# Patient Record
Sex: Male | Born: 1970 | Race: White | Hispanic: No | Marital: Married | Smoking: Current every day smoker
Health system: Southern US, Community
[De-identification: ages and names within clinical notes are randomized; demographics above are authoritative.]

## PROBLEM LIST (undated history)

## (undated) DIAGNOSIS — M549 Dorsalgia, unspecified: Secondary | ICD-10-CM

## (undated) DIAGNOSIS — M109 Gout, unspecified: Secondary | ICD-10-CM

---

## 2007-09-15 ENCOUNTER — Emergency Department (HOSPITAL_COMMUNITY): Admission: EM | Admit: 2007-09-15 | Discharge: 2007-09-15 | Payer: Self-pay | Admitting: Emergency Medicine

## 2009-12-05 ENCOUNTER — Emergency Department (HOSPITAL_BASED_OUTPATIENT_CLINIC_OR_DEPARTMENT_OTHER): Admission: EM | Admit: 2009-12-05 | Discharge: 2009-12-05 | Payer: Self-pay | Admitting: Emergency Medicine

## 2009-12-05 ENCOUNTER — Ambulatory Visit: Payer: Self-pay | Admitting: Interventional Radiology

## 2009-12-15 ENCOUNTER — Encounter: Admission: RE | Admit: 2009-12-15 | Discharge: 2009-12-15 | Payer: Self-pay | Admitting: Emergency Medicine

## 2010-11-12 LAB — ETHANOL: Alcohol, Ethyl (B): 65 — ABNORMAL HIGH

## 2017-02-13 ENCOUNTER — Encounter (HOSPITAL_COMMUNITY): Payer: Self-pay | Admitting: Emergency Medicine

## 2017-02-13 ENCOUNTER — Emergency Department (HOSPITAL_COMMUNITY)
Admission: EM | Admit: 2017-02-13 | Discharge: 2017-02-13 | Disposition: A | Discharge status: Home / Self Care | Payer: Managed Care, Other (non HMO) | Source: Home / Self Care

## 2017-02-13 ENCOUNTER — Emergency Department (HOSPITAL_BASED_OUTPATIENT_CLINIC_OR_DEPARTMENT_OTHER)
Admission: EM | Admit: 2017-02-13 | Discharge: 2017-02-13 | Disposition: A | Discharge status: Home / Self Care | Payer: Managed Care, Other (non HMO) | Attending: Emergency Medicine | Admitting: Emergency Medicine

## 2017-02-13 ENCOUNTER — Other Ambulatory Visit: Payer: Self-pay

## 2017-02-13 ENCOUNTER — Encounter (HOSPITAL_BASED_OUTPATIENT_CLINIC_OR_DEPARTMENT_OTHER): Payer: Self-pay | Admitting: *Deleted

## 2017-02-13 DIAGNOSIS — S61211A Laceration without foreign body of left index finger without damage to nail, initial encounter: Secondary | ICD-10-CM | POA: Diagnosis not present

## 2017-02-13 DIAGNOSIS — W290XXA Contact with powered kitchen appliance, initial encounter: Secondary | ICD-10-CM | POA: Insufficient documentation

## 2017-02-13 DIAGNOSIS — Z5321 Procedure and treatment not carried out due to patient leaving prior to being seen by health care provider: Secondary | ICD-10-CM

## 2017-02-13 DIAGNOSIS — Y939 Activity, unspecified: Secondary | ICD-10-CM | POA: Insufficient documentation

## 2017-02-13 DIAGNOSIS — S61213A Laceration without foreign body of left middle finger without damage to nail, initial encounter: Secondary | ICD-10-CM | POA: Diagnosis not present

## 2017-02-13 DIAGNOSIS — Y929 Unspecified place or not applicable: Secondary | ICD-10-CM | POA: Insufficient documentation

## 2017-02-13 DIAGNOSIS — F17290 Nicotine dependence, other tobacco product, uncomplicated: Secondary | ICD-10-CM | POA: Diagnosis not present

## 2017-02-13 DIAGNOSIS — S60415A Abrasion of left ring finger, initial encounter: Secondary | ICD-10-CM

## 2017-02-13 DIAGNOSIS — S61221A Laceration with foreign body of left index finger without damage to nail, initial encounter: Secondary | ICD-10-CM | POA: Insufficient documentation

## 2017-02-13 DIAGNOSIS — Y999 Unspecified external cause status: Secondary | ICD-10-CM | POA: Insufficient documentation

## 2017-02-13 DIAGNOSIS — W269XXA Contact with unspecified sharp object(s), initial encounter: Secondary | ICD-10-CM | POA: Insufficient documentation

## 2017-02-13 DIAGNOSIS — S6992XA Unspecified injury of left wrist, hand and finger(s), initial encounter: Secondary | ICD-10-CM | POA: Diagnosis present

## 2017-02-13 HISTORY — DX: Gout, unspecified: M10.9

## 2017-02-13 MED ORDER — LIDOCAINE HCL (PF) 1 % IJ SOLN
10.0000 mL | Freq: Once | INTRAMUSCULAR | Status: AC
Start: 2017-02-13 — End: 2017-02-13
  Administered 2017-02-13: 10 mL
  Filled 2017-02-13: qty 10

## 2017-02-13 NOTE — ED Provider Notes (Signed)
[facility] EMERGENCY DEPARTMENT Provider Note   CSN: [id number] Arrival date & time: 02/13/17  [id number]     History   Chief Complaint Chief Complaint  Patient presents with  . Laceration    HPI Roy Molina is a 46 y.o. male.  HPI   46 year old male with finger lacerations.  Happened just before arrival.  Reaching into the dishwasher and cut his hand sharp plates of food processor.  He sustained lacerations to the dorsal aspect of index and middle fingers.  No numbness or tingling.  Controlled with pressure.  Reports that tetanus is current.  Past Medical History:  Diagnosis Date  . Gout     There are no active problems to display for this patient.   History reviewed. No pertinent surgical history.     Home Medications    Prior to Admission medications   Not on File    Family History No family history on file.  Social History Social History   Tobacco Use  . Smoking status: Current Every Day Smoker    Types: E-cigarettes  . Smokeless tobacco: Current User  Substance Use Topics  . Alcohol use: Yes    Comment: daily  . Drug use: No     Allergies   Bee venom; Molds & smuts; and Penicillins   Review of Systems Review of Systems  All systems reviewed and negative, other than as noted in HPI.  Physical Exam Updated Vital Signs BP (!) 120/100 (BP Location: Right Arm)   Pulse 88   Temp 97.9 F (36.6 C) (Oral)   Resp 16   Ht 5\' 10"  (1.778 m)   Wt 97.5 kg (215 lb)   SpO2 98%   BMI 30.85 kg/m   Physical Exam  Constitutional: He appears well-developed and well-nourished. No distress.  HENT:  Head: Normocephalic and atraumatic.  Eyes: Conjunctivae are normal. Right eye exhibits no discharge. Left eye exhibits no discharge.  Neck: Neck supple.  Cardiovascular: Normal rate, regular rhythm and normal heart sounds. Exam reveals no gallop and no friction rub.  No murmur heard. Pulmonary/Chest: Effort normal and breath sounds normal. No  respiratory distress.  Abdominal: Soft. He exhibits no distension. There is no tenderness.  Musculoskeletal: He exhibits no edema or tenderness.       Hands: Lacerations in the pictured area.  The laceration over the middle finger has a thin flap  He is able to actively extend both fingers against resistance.  No tendon or FB visualized.  Neurovascularly distally.  Mild bleeding.  Neurological: He is alert.  Skin: Skin is warm and dry.  Psychiatric: He has a normal mood and affect. His behavior is normal. Thought content normal.  Nursing note and vitals reviewed.    ED Treatments / Results  Labs (all labs ordered are listed, but only abnormal results are displayed) Labs Reviewed - No data to display  EKG  EKG Interpretation None       Radiology No results found.  Procedures Procedures (including critical care time)  LACERATION REPAIR Performed by: Raeford RazorStephen Annisten [place] Authorized by: Raeford RazorStephen Valerian Jewel Consent: Verbal consent obtained. Risks and benefits: risks, benefits and alternatives were discussed Consent given by: patient Patient identity confirmed: provided demographic data Prepped and Draped in normal sterile fashion Wound explored  Laceration Location: L index finger Laceration Length: 2.5 cm  No Foreign Bodies seen or palpated  Anesthesia: Digital block 1% lidocaine. Anesthetic total: 1 ml  Irrigation method: Copiously irrigated in the sink  Skin closure: Simple interrupted  using 4-0 Prolene  Number of sutures: 4  Patient tolerance: Patient tolerated the procedure well with no immediate complications.  LACERATION REPAIR Performed by: Raeford RazorStephen Kashmere Daywalt Authorized by: Raeford RazorStephen Vanden Fawaz Consent: Verbal consent obtained. Risks and benefits: risks, benefits and alternatives were discussed Consent given by: patient Patient identity confirmed: provided demographic data Prepped and Draped in normal sterile fashion Wound explored  Laceration Location: middle  finger  Laceration Length: 3cm  No Foreign Bodies seen or palpated  Anesthesia: local infiltration  Local anesthetic: Digital block with 1% lidocaine  Anesthetic total: 2 ml  Irrigation method: syringe Amount of cleaning: standard  Skin closure: 4-0 Prolene.  Simple interrupted and horizontal mattress sutures.  Number of sutures: 6   Patient tolerance: Patient tolerated the procedure well with no immediate complications.  NERVE BLOCK Performed by: Raeford RazorStephen Suezette Lafave Consent: Verbal consent obtained. Required items: required blood products, implants, devices, and special equipment available Time out: Immediately prior to procedure a "time out" was called to verify the correct patient, procedure, equipment, support staff and site/side marked as required.  Indication: Pain relief, wound exploration Nerve block body site: Middle and index fingers left hand  Preparation: Patient was prepped and draped in the usual sterile fashion. Needle gauge: 25 G Location technique: anatomical landmarks  Local anesthetic: 1% lidocaine without epinephrine  Anesthetic total: 3 ml  Outcome: pain improved Patient tolerance: Patient tolerated the procedure well with no immediate complications.  Medications Ordered in ED Medications  lidocaine (PF) (XYLOCAINE) 1 % injection 10 mL (10 mLs Other Given by Other 02/13/17 [id number])     Initial Impression / Assessment and Plan / ED Course  I have reviewed the triage vital signs and the nursing notes.  Pertinent labs & imaging results that were available during my care of the patient were reviewed by me and considered in my medical decision making (see chart for details).     46yM with lacerations to index and ring fingers L hand. NVI. No tendon involvement. NVI. Sutured. Continued wound care.   Final Clinical Impressions(s) / ED Diagnoses   Final diagnoses:  Laceration of left index finger without foreign body without damage to nail, initial  encounter  Laceration of left middle finger without foreign body without damage to nail, initial encounter  Abrasion of left ring finger, initial encounter    ED Discharge Orders    None       Raeford RazorKohut, Jaivyn Gulla, MD 02/13/17 1027

## 2017-02-13 NOTE — ED Triage Notes (Signed)
Pt states while reaching in dishwasher this am, L hand hit food processor blade causing lacerations to posterior 1st, 2nd and small lac to 3rd finger. Bleeding controlled, bandage applied.

## 2017-02-13 NOTE — ED Notes (Signed)
Notified by nurse first staff that pt left

## 2017-02-13 NOTE — ED Notes (Signed)
ED Provider at bedside. 

## 2017-02-13 NOTE — ED Triage Notes (Signed)
Pt reports cutting his L 1st, 2nd and 3rd fingers after reaching into dishwasher and hitting a blade from a food processor. Fingers wrapped in gauze -- dressing clean, dry and intact.

## 2017-02-13 NOTE — ED Notes (Addendum)
This NT tried talking with pt and visitor about staying and advised that there will be a PA that is coming in at 0900 and we will be seeing the pt for the laceration immediately. Pt acknowledge and notified visitor, visitor was adamant about leaving and going to another facility.

## 2018-03-03 ENCOUNTER — Emergency Department (HOSPITAL_BASED_OUTPATIENT_CLINIC_OR_DEPARTMENT_OTHER)
Admission: EM | Admit: 2018-03-03 | Discharge: 2018-03-03 | Disposition: A | Discharge status: Home / Self Care | Payer: BLUE CROSS/BLUE SHIELD | Attending: Emergency Medicine | Admitting: Emergency Medicine

## 2018-03-03 ENCOUNTER — Other Ambulatory Visit: Payer: Self-pay

## 2018-03-03 ENCOUNTER — Encounter (HOSPITAL_BASED_OUTPATIENT_CLINIC_OR_DEPARTMENT_OTHER): Payer: Self-pay | Admitting: Emergency Medicine

## 2018-03-03 DIAGNOSIS — F17290 Nicotine dependence, other tobacco product, uncomplicated: Secondary | ICD-10-CM | POA: Insufficient documentation

## 2018-03-03 DIAGNOSIS — L03116 Cellulitis of left lower limb: Secondary | ICD-10-CM | POA: Diagnosis not present

## 2018-03-03 DIAGNOSIS — R2242 Localized swelling, mass and lump, left lower limb: Secondary | ICD-10-CM | POA: Diagnosis present

## 2018-03-03 MED ORDER — DOXYCYCLINE HYCLATE 100 MG PO CAPS: 100.0000 mg | ORAL_CAPSULE | Freq: Two times a day (BID) | ORAL | 0 refills | Status: DC

## 2018-03-03 MED ORDER — DOXYCYCLINE HYCLATE 100 MG PO TABS
100.0000 mg | ORAL_TABLET | Freq: Once | ORAL | Status: AC
  Administered 2018-03-03: 100 mg via ORAL
  Filled 2018-03-03: qty 1

## 2018-03-03 NOTE — ED Provider Notes (Signed)
[facility] EMERGENCY DEPARTMENT Provider Note   CSN: [id number] Arrival date & time: 03/03/18  1423     History   Chief Complaint Chief Complaint  Patient presents with  . Abscess    HPI Roy Molina is a 48 y.o. male history of gout here presenting with left eye redness.  He states that he may have been bitten by a spider about 2 days ago on the left inner thigh.  Patient states that he noticed that the area became more swollen and painful.  Denies any purulent discharge.  Denies any fevers or chills and patient does not use IV drugs.  Patient states that he had previous spider bites that required oral antibiotics but never required I&D.  The history is provided by the patient.    Past Medical History:  Diagnosis Date  . Gout     There are no active problems to display for this patient.   History reviewed. No pertinent surgical history.      Home Medications    Prior to Admission medications   Not on File    Family History No family history on file.  Social History Social History   Tobacco Use  . Smoking status: Current Every Day Smoker    Types: E-cigarettes  . Smokeless tobacco: Current User  Substance Use Topics  . Alcohol use: Yes    Comment: daily  . Drug use: No     Allergies   Bee venom; Molds & smuts; and Penicillins   Review of Systems Review of Systems  Skin: Positive for color change.  All other systems reviewed and are negative.    Physical Exam Updated Vital Signs BP (!) 135/96 (BP Location: Left Arm)   Pulse 88   Temp 98.4 F (36.9 C) (Oral)   Resp 18   Ht 5\' 10"  (1.778 m)   Wt 102.1 kg   SpO2 98%   BMI 32.28 kg/m   Physical Exam Vitals signs and nursing note reviewed.  Constitutional:      Appearance: Normal appearance.  HENT:     Head: Normocephalic.     Right Ear: Tympanic membrane normal.     Left Ear: Tympanic membrane normal.     Nose: Nose normal.     Mouth/Throat:     Mouth: Mucous  membranes are moist.  Eyes:     Extraocular Movements: Extraocular movements intact.     Pupils: Pupils are equal, round, and reactive to light.  Neck:     Musculoskeletal: Normal range of motion.  Cardiovascular:     Rate and Rhythm: Normal rate and regular rhythm.     Pulses: Normal pulses.     Heart sounds: Normal heart sounds.  Pulmonary:     Effort: Pulmonary effort is normal.     Breath sounds: Normal breath sounds.  Abdominal:     General: Abdomen is flat.     Palpations: Abdomen is soft.  Musculoskeletal: Normal range of motion.     Comments: L inner thigh with 3 cm area of erythema, no obvious fluctuance,   Skin:    General: Skin is warm.     Capillary Refill: Capillary refill takes less than 2 seconds.     Findings: Erythema present.  Neurological:     General: No focal deficit present.     Mental Status: He is alert and oriented to person, place, and time.  Psychiatric:        Mood and Affect: Mood normal.  Behavior: Behavior normal.      ED Treatments / Results  Labs (all labs ordered are listed, but only abnormal results are displayed) Labs Reviewed - No data to display  EKG None  Radiology No results found.  Procedures Procedures (including critical care time)  EMERGENCY DEPARTMENT US SOFT TISSUE INTERPRETATION "Study: Limited Soft Tissue Ultrasound"  INDICATIONS: Soft tissue infection Multiple views of the body part were obtained in real-time with a multi-frequency linear probe  PERFORMED BY: Myself IMAGES ARCHIVED?: Yes SIDE:Left BODY PART:Lower extremity INTERPRETATION:  No abcess noted and Cellulitis present     Medications Ordered in ED Medications - No data to display   Initial Impression / Assessment and Plan / ED Course  I have reviewed the triage vital signs and the nursing notes.  Pertinent labs & imaging results that were available during my care of the patient were reviewed by me and considered in my medical decision  making (see chart for details).     Roy Molina is a 48 y.o. male here with possible bug bite to L lower leg. There is no obvious abscess on [place], just mild cellulitis. Will dc home with course of doxycycline. Gave strict return precautions    Final Clinical Impressions(s) / ED Diagnoses   Final diagnoses:  None    ED Discharge Orders    None       Charlynne Pander, MD 03/03/18 0900

## 2018-03-03 NOTE — Discharge Instructions (Signed)
Take doxycycline twice daily for a week   Take tylenol, motrin for pain   See your doctor   Return to ER if you have worse leg pain, swelling, purulent discharge, fever.

## 2018-03-03 NOTE — ED Notes (Signed)
ED Provider at bedside. 

## 2018-03-03 NOTE — ED Triage Notes (Signed)
Pt reports possible spider bite or abscess to L inner thigh x 2 days.

## 2019-05-10 ENCOUNTER — Ambulatory Visit: Payer: BLUE CROSS/BLUE SHIELD

## 2020-05-06 ENCOUNTER — Other Ambulatory Visit: Payer: Self-pay

## 2020-05-06 ENCOUNTER — Encounter: Payer: Self-pay | Admitting: Plastic Surgery

## 2020-05-06 ENCOUNTER — Ambulatory Visit (INDEPENDENT_AMBULATORY_CARE_PROVIDER_SITE_OTHER): Payer: BC Managed Care – PPO | Admitting: Plastic Surgery

## 2020-05-06 VITALS — BP 125/88 | HR 90 | Ht 70.0 in | Wt 228.4 lb

## 2020-05-06 DIAGNOSIS — L989 Disorder of the skin and subcutaneous tissue, unspecified: Secondary | ICD-10-CM | POA: Diagnosis not present

## 2020-05-06 NOTE — Progress Notes (Signed)
   Referring Provider Medicine, [facility] [address] [place],  [place] [address]   CC:  Chief Complaint  Patient presents with  . consult      Roy Molina is an 50 y.o. male.  HPI: Patient presents to discuss a left shin cystic lesion.  Is been present for at least several months.  It is painful particularly in the mornings and seems to be more painful when he walks.  He is interested in having it removed.  Allergies  Allergen Reactions  . Bee Venom   . Molds & Smuts   . Penicillins     Outpatient Encounter Medications as of 05/06/2020  Medication Sig  . gabapentin (NEURONTIN) 100 MG capsule Take 200 mg by mouth 2 (two) times daily.  Marland Kitchen doxycycline (VIBRAMYCIN) 100 MG capsule Take 1 capsule (100 mg total) by mouth 2 (two) times daily. One po bid x 7 days   No facility-administered encounter medications on file as of 05/06/2020.     Past Medical History:  Diagnosis Date  . Gout     No past surgical history on file.  No family history on file.  Social History   Social History Narrative  . Not on file     Review of Systems General: Denies fevers, chills, weight loss CV: Denies chest pain, shortness of breath, palpitations  Physical Exam Vitals with BMI 05/06/2020 03/03/2018 02/13/2017  Height 5\' 10"  5\' 10"  -  Weight 228 lbs 6 oz 225 lbs -  BMI 32.77 32.28 -  Systolic 125 135  Diastolic 88 96 93  Pulse 90 88 84    General:  No acute distress,  Alert and oriented, Non-Toxic, Normal speech and affect Examination shows a 1 to 2 cm sized cystic lesion in the middle third of the left leg.  This is lateral to the tibia.  There is no overlying skin changes.  Assessment/Plan Patient presents with a symptomatic small cystic lesion of the left lower extremity.  We discussed excision.  We discussed the risk that include bleeding, infection, damage to surrounding structures need for additional procedures.  All his questions were  answered and we will plan to move forward.  05/06/2020, 11:01 AM

## 2020-06-18 ENCOUNTER — Other Ambulatory Visit (HOSPITAL_COMMUNITY)
Admission: RE | Admit: 2020-06-18 | Discharge: 2020-06-18 | Disposition: A | Discharge status: Home / Self Care | Payer: BC Managed Care – PPO | Source: Ambulatory Visit | Attending: Plastic Surgery | Admitting: Plastic Surgery

## 2020-06-18 ENCOUNTER — Encounter: Payer: Self-pay | Admitting: Plastic Surgery

## 2020-06-18 ENCOUNTER — Ambulatory Visit (INDEPENDENT_AMBULATORY_CARE_PROVIDER_SITE_OTHER): Payer: BC Managed Care – PPO | Admitting: Plastic Surgery

## 2020-06-18 ENCOUNTER — Other Ambulatory Visit: Payer: Self-pay

## 2020-06-18 VITALS — BP 132/83 | HR 91

## 2020-06-18 DIAGNOSIS — L989 Disorder of the skin and subcutaneous tissue, unspecified: Secondary | ICD-10-CM

## 2020-06-18 NOTE — Progress Notes (Signed)
Operative Note   DATE OF OPERATION: 06/18/2020  LOCATION:    SURGICAL DEPARTMENT: Plastic Surgery  PREOPERATIVE DIAGNOSES: Left leg cyst  POSTOPERATIVE DIAGNOSES:  same  PROCEDURE:  1. Excision of left leg cyst measuring 2 cm 2. Complex closure measuring 2 cm  SURGEON: Ancil Linsey, MD  ANESTHESIA:  Local  COMPLICATIONS: None.   INDICATIONS FOR PROCEDURE:  The patient, Roy Molina is a 50 y.o. male born on [birth year], is here for treatment of left leg cyst MRN: [id number]  CONSENT:  Informed consent was obtained directly from the patient. Risks, benefits and alternatives were fully discussed. Specific risks including but not limited to bleeding, infection, hematoma, seroma, scarring, pain, infection, wound healing problems, and need for further surgery were all discussed. The patient did have an ample opportunity to have questions answered to satisfaction.   DESCRIPTION OF PROCEDURE:  Local anesthesia was administered. The patient's operative site was prepped and draped in a sterile fashion. A time out was performed and all information was confirmed to be correct.  The lesion was excised with a 15 blade.  Hemostasis was obtained.  The excision went down to fascia.  No other subcutaneous masses were palpated deep to this.  Circumferential undermining was performed and the skin was advanced and closed in layers with interrupted buried Monocryl sutures and 3-0 Monocryl for the skin.    The patient tolerated the procedure well.  There were no complications.

## 2020-06-22 LAB — SURGICAL PATHOLOGY

## 2020-06-29 ENCOUNTER — Ambulatory Visit (INDEPENDENT_AMBULATORY_CARE_PROVIDER_SITE_OTHER): Payer: BC Managed Care – PPO | Admitting: Surgical

## 2020-06-29 ENCOUNTER — Other Ambulatory Visit: Payer: Self-pay

## 2020-06-29 DIAGNOSIS — L989 Disorder of the skin and subcutaneous tissue, unspecified: Secondary | ICD-10-CM

## 2020-06-29 NOTE — Progress Notes (Signed)
50 year old male here for follow-up after excision of left leg cyst with Dr. Arita Miss on 06/18/2020.  He is 2 weeks postop. Pathology showed fat necrosis He is doing well.  On exam the incision is intact and healing well.  There is some surrounding redness that appears irritated.  No cellulitic changes.  No fluctuance or purulence noted.  Suture knot and suture removed.  Patient tolerated this well.  The area was covered with a Steri-Strip.  Recommend leaving this in place for a few days or until it falls off.  Recommend calling with questions or concerns, follow-up as needed.

## 2020-10-12 ENCOUNTER — Inpatient Hospital Stay (HOSPITAL_COMMUNITY)
Admission: EM | Admit: 2020-10-12 | Discharge: 2020-10-15 | DRG: 440 | Disposition: A | Discharge status: Home / Self Care | Payer: BC Managed Care – PPO | Attending: Student in an Organized Health Care Education/Training Program | Admitting: Student in an Organized Health Care Education/Training Program

## 2020-10-12 ENCOUNTER — Encounter (HOSPITAL_COMMUNITY): Payer: Self-pay

## 2020-10-12 ENCOUNTER — Other Ambulatory Visit: Payer: Self-pay

## 2020-10-12 DIAGNOSIS — Z880 Allergy status to penicillin: Secondary | ICD-10-CM

## 2020-10-12 DIAGNOSIS — Z79899 Other long term (current) drug therapy: Secondary | ICD-10-CM

## 2020-10-12 DIAGNOSIS — K859 Acute pancreatitis without necrosis or infection, unspecified: Secondary | ICD-10-CM | POA: Diagnosis not present

## 2020-10-12 DIAGNOSIS — K852 Alcohol induced acute pancreatitis without necrosis or infection: Secondary | ICD-10-CM | POA: Diagnosis not present

## 2020-10-12 DIAGNOSIS — Z91030 Bee allergy status: Secondary | ICD-10-CM

## 2020-10-12 DIAGNOSIS — Z20822 Contact with and (suspected) exposure to covid-19: Secondary | ICD-10-CM | POA: Diagnosis present

## 2020-10-12 DIAGNOSIS — Z87892 Personal history of anaphylaxis: Secondary | ICD-10-CM

## 2020-10-12 DIAGNOSIS — Z791 Long term (current) use of non-steroidal anti-inflammatories (NSAID): Secondary | ICD-10-CM

## 2020-10-12 DIAGNOSIS — Z91048 Other nonmedicinal substance allergy status: Secondary | ICD-10-CM

## 2020-10-12 DIAGNOSIS — M109 Gout, unspecified: Secondary | ICD-10-CM | POA: Diagnosis present

## 2020-10-12 DIAGNOSIS — J45909 Unspecified asthma, uncomplicated: Secondary | ICD-10-CM | POA: Diagnosis present

## 2020-10-12 DIAGNOSIS — K853 Drug induced acute pancreatitis without necrosis or infection: Secondary | ICD-10-CM | POA: Diagnosis present

## 2020-10-12 DIAGNOSIS — F17290 Nicotine dependence, other tobacco product, uncomplicated: Secondary | ICD-10-CM | POA: Diagnosis present

## 2020-10-12 DIAGNOSIS — T39395A Adverse effect of other nonsteroidal anti-inflammatory drugs [NSAID], initial encounter: Secondary | ICD-10-CM | POA: Diagnosis present

## 2020-10-12 HISTORY — DX: Dorsalgia, unspecified: M54.9

## 2020-10-12 LAB — CBC WITH DIFFERENTIAL/PLATELET
Abs Immature Granulocytes: 0.08 10*3/uL — ABNORMAL HIGH (ref 0.00–0.07)
Basophils Absolute: 0.1 10*3/uL (ref 0.0–0.1)
Basophils Relative: 0 %
Eosinophils Absolute: 0.1 10*3/uL (ref 0.0–0.5)
Eosinophils Relative: 1 %
HCT: 46.2 % (ref 39.0–52.0)
Hemoglobin: 15.8 g/dL (ref 13.0–17.0)
Immature Granulocytes: 1 %
Lymphocytes Relative: 10 %
Lymphs Abs: 1.8 10*3/uL (ref 0.7–4.0)
MCH: 30.3 pg (ref 26.0–34.0)
MCHC: 34.2 g/dL (ref 30.0–36.0)
MCV: 88.7 fL (ref 80.0–100.0)
Monocytes Absolute: 1.3 10*3/uL — ABNORMAL HIGH (ref 0.1–1.0)
Monocytes Relative: 8 %
Neutro Abs: 14 10*3/uL — ABNORMAL HIGH (ref 1.7–7.7)
Neutrophils Relative %: 80 %
Platelets: 330 10*3/uL (ref 150–400)
RBC: 5.21 MIL/uL (ref 4.22–5.81)
RDW: 13.1 % (ref 11.5–15.5)
WBC: 17.3 10*3/uL — ABNORMAL HIGH (ref 4.0–10.5)
nRBC: 0 % (ref 0.0–0.2)

## 2020-10-12 LAB — URINALYSIS, ROUTINE W REFLEX MICROSCOPIC
Bilirubin Urine: NEGATIVE
Glucose, UA: NEGATIVE mg/dL
Hgb urine dipstick: NEGATIVE
Ketones, ur: NEGATIVE mg/dL
Leukocytes,Ua: NEGATIVE
Nitrite: NEGATIVE
Protein, ur: NEGATIVE mg/dL
Specific Gravity, Urine: 1.02 (ref 1.005–1.030)
pH: 6 (ref 5.0–8.0)

## 2020-10-12 LAB — COMPREHENSIVE METABOLIC PANEL
ALT: 27 U/L (ref 0–44)
AST: 21 U/L (ref 15–41)
Albumin: 4.1 g/dL (ref 3.5–5.0)
Alkaline Phosphatase: 57 U/L (ref 38–126)
Anion gap: 13 (ref 5–15)
BUN: 14 mg/dL (ref 6–20)
CO2: 23 mmol/L (ref 22–32)
Calcium: 9.2 mg/dL (ref 8.9–10.3)
Chloride: 101 mmol/L (ref 98–111)
Creatinine, Ser: 1.06 mg/dL (ref 0.61–1.24)
GFR, Estimated: >60 mL/min (ref >60)
Glucose, Bld: 142 mg/dL — ABNORMAL HIGH (ref 70–99)
Potassium: 3.9 mmol/L (ref 3.5–5.1)
Sodium: 137 mmol/L (ref 135–145)
Total Bilirubin: 1.4 mg/dL — ABNORMAL HIGH (ref 0.3–1.2)
Total Protein: 7.2 g/dL (ref 6.5–8.1)

## 2020-10-12 LAB — LIPASE, BLOOD: Lipase: 509 U/L — ABNORMAL HIGH (ref 11–51)

## 2020-10-12 NOTE — ED Triage Notes (Signed)
Pt c/o severe abdominal pain and cold sweats since 1300 today. Pt denies nausea, vomiting, or diarrhea.

## 2020-10-13 ENCOUNTER — Encounter (HOSPITAL_COMMUNITY): Payer: Self-pay | Admitting: Student

## 2020-10-13 ENCOUNTER — Emergency Department (HOSPITAL_COMMUNITY): Payer: BC Managed Care – PPO

## 2020-10-13 ENCOUNTER — Observation Stay (HOSPITAL_COMMUNITY): Payer: BC Managed Care – PPO

## 2020-10-13 DIAGNOSIS — J45909 Unspecified asthma, uncomplicated: Secondary | ICD-10-CM | POA: Diagnosis present

## 2020-10-13 DIAGNOSIS — Z91048 Other nonmedicinal substance allergy status: Secondary | ICD-10-CM | POA: Diagnosis not present

## 2020-10-13 DIAGNOSIS — Z91030 Bee allergy status: Secondary | ICD-10-CM | POA: Diagnosis not present

## 2020-10-13 DIAGNOSIS — T39395A Adverse effect of other nonsteroidal anti-inflammatory drugs [NSAID], initial encounter: Secondary | ICD-10-CM | POA: Diagnosis present

## 2020-10-13 DIAGNOSIS — Z20822 Contact with and (suspected) exposure to covid-19: Secondary | ICD-10-CM | POA: Diagnosis present

## 2020-10-13 DIAGNOSIS — K853 Drug induced acute pancreatitis without necrosis or infection: Secondary | ICD-10-CM | POA: Diagnosis present

## 2020-10-13 DIAGNOSIS — Z791 Long term (current) use of non-steroidal anti-inflammatories (NSAID): Secondary | ICD-10-CM | POA: Diagnosis not present

## 2020-10-13 DIAGNOSIS — Z880 Allergy status to penicillin: Secondary | ICD-10-CM | POA: Diagnosis not present

## 2020-10-13 DIAGNOSIS — K852 Alcohol induced acute pancreatitis without necrosis or infection: Secondary | ICD-10-CM | POA: Diagnosis present

## 2020-10-13 DIAGNOSIS — K859 Acute pancreatitis without necrosis or infection, unspecified: Secondary | ICD-10-CM | POA: Diagnosis present

## 2020-10-13 DIAGNOSIS — Z79899 Other long term (current) drug therapy: Secondary | ICD-10-CM | POA: Diagnosis not present

## 2020-10-13 DIAGNOSIS — Z87892 Personal history of anaphylaxis: Secondary | ICD-10-CM | POA: Diagnosis not present

## 2020-10-13 DIAGNOSIS — F17290 Nicotine dependence, other tobacco product, uncomplicated: Secondary | ICD-10-CM | POA: Diagnosis present

## 2020-10-13 DIAGNOSIS — M109 Gout, unspecified: Secondary | ICD-10-CM | POA: Diagnosis present

## 2020-10-13 LAB — RESP PANEL BY RT-PCR (FLU A&B, COVID) ARPGX2
Influenza A by PCR: NEGATIVE
Influenza B by PCR: NEGATIVE
SARS Coronavirus 2 by RT PCR: NEGATIVE

## 2020-10-13 MED ORDER — ACETAMINOPHEN 325 MG PO TABS
650.0000 mg | ORAL_TABLET | Freq: Four times a day (QID) | ORAL | Status: DC | PRN
  Administered 2020-10-14 – 2020-10-15 (×3): 650 mg via ORAL
  Filled 2020-10-13 (×3): qty 2

## 2020-10-13 MED ORDER — LACTATED RINGERS IV SOLN: INTRAVENOUS | Status: AC

## 2020-10-13 MED ORDER — ONDANSETRON HCL 4 MG/2ML IJ SOLN
4.0000 mg | Freq: Four times a day (QID) | INTRAMUSCULAR | Status: DC | PRN
  Administered 2020-10-13 – 2020-10-14 (×3): 4 mg via INTRAVENOUS
  Filled 2020-10-13 (×3): qty 2

## 2020-10-13 MED ORDER — SENNOSIDES-DOCUSATE SODIUM 8.6-50 MG PO TABS: 1.0000 | ORAL_TABLET | Freq: Every evening | ORAL | Status: DC | PRN

## 2020-10-13 MED ORDER — RIVAROXABAN 10 MG PO TABS
10.0000 mg | ORAL_TABLET | Freq: Every day | ORAL | Status: DC
  Administered 2020-10-13 – 2020-10-15 (×3): 10 mg via ORAL
  Filled 2020-10-13 (×3): qty 1

## 2020-10-13 MED ORDER — ACETAMINOPHEN 650 MG RE SUPP: 650.0000 mg | Freq: Four times a day (QID) | RECTAL | Status: DC | PRN

## 2020-10-13 MED ORDER — MORPHINE SULFATE (PF) 4 MG/ML IV SOLN
4.0000 mg | Freq: Once | INTRAVENOUS | Status: AC
  Administered 2020-10-13: 4 mg via INTRAVENOUS
  Filled 2020-10-13: qty 1

## 2020-10-13 MED ORDER — ONDANSETRON HCL 4 MG/2ML IJ SOLN
4.0000 mg | Freq: Once | INTRAMUSCULAR | Status: AC
  Administered 2020-10-13: 4 mg via INTRAVENOUS
  Filled 2020-10-13: qty 2

## 2020-10-13 MED ORDER — HYDROMORPHONE HCL 1 MG/ML IJ SOLN
1.0000 mg | Freq: Once | INTRAMUSCULAR | Status: AC
  Administered 2020-10-13: 1 mg via INTRAVENOUS
  Filled 2020-10-13: qty 1

## 2020-10-13 MED ORDER — SODIUM CHLORIDE 0.9% FLUSH
3.0000 mL | Freq: Two times a day (BID) | INTRAVENOUS | Status: DC
  Administered 2020-10-13 – 2020-10-15 (×2): 3 mL via INTRAVENOUS

## 2020-10-13 MED ORDER — IOHEXOL 350 MG/ML SOLN
80.0000 mL | Freq: Once | INTRAVENOUS | Status: AC | PRN
  Administered 2020-10-13: 80 mL via INTRAVENOUS

## 2020-10-13 MED ORDER — LACTATED RINGERS IV BOLUS
1000.0000 mL | Freq: Once | INTRAVENOUS | Status: AC
  Administered 2020-10-13: 1000 mL via INTRAVENOUS

## 2020-10-13 MED ORDER — OXYCODONE-ACETAMINOPHEN 5-325 MG PO TABS
1.0000 | ORAL_TABLET | Freq: Once | ORAL | Status: AC
  Administered 2020-10-13: 1 via ORAL
  Filled 2020-10-13: qty 1

## 2020-10-13 MED ORDER — HYDROMORPHONE HCL 1 MG/ML IJ SOLN
1.0000 mg | INTRAMUSCULAR | Status: DC | PRN
Start: 2020-10-13 — End: 2020-10-15
  Administered 2020-10-13 – 2020-10-15 (×8): 1 mg via INTRAVENOUS
  Filled 2020-10-13 (×8): qty 1

## 2020-10-13 MED ORDER — ONDANSETRON HCL 4 MG PO TABS
4.0000 mg | ORAL_TABLET | Freq: Four times a day (QID) | ORAL | Status: DC | PRN
  Administered 2020-10-15: 4 mg via ORAL
  Filled 2020-10-13: qty 1

## 2020-10-13 NOTE — ED Provider Notes (Signed)
[facility] EMERGENCY DEPARTMENT Provider Note   CSN: [id number] Arrival date & time: 10/12/20  2006    History Chief Complaint  Patient presents with   Abdominal Pain    Roy Molina is a 50 y.o. male with a hx of tobacco use who presents to the ED with complaints of abdominal pain since 14:30 yesterday afternoon (08/29). Patient reports that pain is located to the upper abdomen, sharp in nature, constant, without alleviating/aggravating factors. Has had associated nausea with some vomiting. Denies fever, chills, hematemesis, diarrhea, melena, hematochezia, dysuria, or chest pain. Denies recent med changes or excess alcohol intake.   HPI     Past Medical History:  Diagnosis Date   Back pain    Gout     There are no problems to display for this patient.   History reviewed. No pertinent surgical history.     History reviewed. No pertinent family history.  Social History   Tobacco Use   Smoking status: Every Day    Types: E-cigarettes   Smokeless tobacco: Current  Vaping Use   Vaping Use: Every day  Substance Use Topics   Alcohol use: Yes    Comment: daily   Drug use: No    Home Medications Prior to Admission medications   Medication Sig Start Date End Date Taking? Authorizing Provider  gabapentin (NEURONTIN) 100 MG capsule Take 200 mg by mouth 2 (two) times daily. 03/15/20   [provider]    Allergies    Bee venom, Molds & smuts, and Penicillins  Review of Systems   Review of Systems  Constitutional:  Negative for chills and fever.  Cardiovascular:  Negative for chest pain.  Gastrointestinal:  Positive for abdominal pain, nausea and vomiting. Negative for blood in stool, constipation and diarrhea.  Genitourinary:  Negative for dysuria.  Neurological:  Negative for syncope.  All other systems reviewed and are negative.  Physical Exam Updated Vital Signs BP (!) 146/90 (BP Location: Left Arm)   Pulse 87   Temp 97.9 F  (36.6 C) (Oral)   Resp 18   Ht 5\' 10"  (1.778 m)   Wt 104.3 kg   SpO2 97%   BMI 33.00 kg/m   Physical Exam Vitals and nursing note reviewed.  Constitutional:      General: He is not in acute distress.    Appearance: He is well-developed. He is not toxic-appearing.  HENT:     Head: Normocephalic and atraumatic.  Eyes:     General:        Right eye: No discharge.        Left eye: No discharge.     Conjunctiva/sclera: Conjunctivae normal.  Cardiovascular:     Rate and Rhythm: Normal rate and regular rhythm.  Pulmonary:     Effort: Pulmonary effort is normal. No respiratory distress.     Breath sounds: Normal breath sounds. No wheezing, rhonchi or rales.  Abdominal:     General: There is no distension.     Palpations: Abdomen is soft.     Tenderness: There is abdominal tenderness (generalized with increased prominence in the epigastrium & LUQ). There is no guarding or rebound.  Musculoskeletal:     Cervical back: Neck supple.  Skin:    General: Skin is warm and dry.     Findings: No rash.  Neurological:     Mental Status: He is alert.     Comments: Clear speech.   Psychiatric:        Behavior:  Behavior normal.    ED Results / Procedures / Treatments   Labs (all labs ordered are listed, but only abnormal results are displayed) Labs Reviewed  COMPREHENSIVE METABOLIC PANEL - Abnormal; Notable for the following components:      Result Value   Glucose, Bld 142 (*)    Total Bilirubin 1.4 (*)    All other components within normal limits  LIPASE, BLOOD - Abnormal; Notable for the following components:   Lipase 509 (*)    All other components within normal limits  CBC WITH DIFFERENTIAL/PLATELET - Abnormal; Notable for the following components:   WBC 17.3 (*)    Neutro Abs 14.0 (*)    Monocytes Absolute 1.3 (*)    Abs Immature Granulocytes 0.08 (*)    All other components within normal limits  URINALYSIS, ROUTINE W REFLEX MICROSCOPIC    EKG None  Radiology CT  ABDOMEN PELVIS W CONTRAST  Result Date: 10/13/2020 CLINICAL DATA:  Acute abdominal pain for 1 day EXAM: CT ABDOMEN AND PELVIS WITH CONTRAST TECHNIQUE: Multidetector CT imaging of the abdomen and pelvis was performed using the standard protocol following bolus administration of intravenous contrast. CONTRAST:  41mL OMNIPAQUE IOHEXOL 350 MG/ML SOLN COMPARISON:  None. FINDINGS: Lower chest:  No contributory findings. Hepatobiliary: No focal liver abnormality. Borderline hepatic steatosis. No evidence of biliary obstruction or stone. Pancreas: Low-density expansion of the pancreas, especially affecting the body and tail with peripancreatic edema. No organized collection or necrosis. No visible vascular complication. Spleen: Unremarkable. Adrenals/Urinary Tract: Negative adrenals. No hydronephrosis or stone. Unremarkable bladder. Stomach/Bowel:  No obstruction. No appendicitis. Vascular/Lymphatic: No acute vascular abnormality. Atheromatous calcification of the aorta. No mass or adenopathy. Reproductive:No pathologic findings. Other: No pneumoperitoneum. Trace ascites small, fatty umbilical hernia. Musculoskeletal: No acute abnormalities. IMPRESSION: Acute edematous pancreatitis without collection. Electronically Signed   By: Marnee Spring M.D.   On: 10/13/2020 07:46    Procedures Procedures   Medications Ordered in ED Medications  oxyCODONE-acetaminophen (PERCOCET/ROXICET) 5-325 MG per tablet 1 tablet (1 tablet Oral Given 10/13/20 0034)    ED Course  I have reviewed the triage vital signs and the nursing notes.  Pertinent labs & imaging results that were available during my care of the patient were reviewed by me and considered in my medical decision making (see chart for details).    MDM Rules/Calculators/A&P                           Patient presents to the ED with complaints of abdominal pain. Patient nontoxic appearing, in no apparent distress, vitals with elevated BP. On exam patient tender  to the generalized abdomen most prominent in the epigastrium & LUQ, no peritoneal signs.  Analgesics, anti-emetics, and fluids administered.   Additional history obtained:  Additional history obtained from chart review & nursing note review.   Lab Tests:  I reviewed and interpreted labs, which included:  CBC: Leukocytosis  CMP: hyperglycemia with mild t bili elevation.  Lipase: Elevated @ 509 UA: No UTI  Imaging Studies ordered:  CT A/P ordered- I independently reviewed, formal radiology impression shows: Acute edematous pancreatitis without collection.  ED Course:  Patient w/ acute pancreatitis  Continued discomfort, will discuss w/ medicine for admission.   Discussed with internal medicine residency service, accept admission.  This is a shared visit with supervising physician Dr. Stevie Kern who has independently evaluated patient & provided guidance in evaluation/management/disposition, in agreement with care   Portions of this note were  generated with Scientist, clinical (histocompatibility and immunogenetics). Dictation errors may occur despite best attempts at proofreading.   Final Clinical Impression(s) / ED Diagnoses Final diagnoses:  Acute pancreatitis, unspecified complication status, unspecified pancreatitis type  Pancreatitis    Rx / DC Orders ED Discharge Orders     None        Cherly Anderson, PA-C 10/13/20 1534    Milagros Loll, MD 10/15/20 [phone]

## 2020-10-13 NOTE — H&P (Signed)
Date: 10/13/2020               Patient Name:  Roy Molina MRN: [id number]  DOB: 07/09/1970 Age / Sex: 50 y.o., male   PCP: Medicine, [place] Service: Internal Medicine Teaching Service         Attending Physician: Dr. Heber Molina, Roy Moulds, DO    First Contact: Roy Schuller, MD Pager: Roy Molina [phone]  Second Contact: Roy Hansen, MD Pager: Roy Molina [phone]       After Hours (After 5p/  First Contact Pager: [phone]  weekends / holidays): Second Contact Pager: [phone]   SUBJECTIVE  Chief Complaint: abdominal pain  History of Present Illness: Roy Molina is a 50 y.o. male with a pertinent PMH of asthma and gout, who presents to [facility] with abdominal pain. History obtained by patient and spouse at bedside.   Roy Molina reports that he was in his usual state of health until yesterday afternoon. Patient had a chicken sandwich from [employer] around 1330 and started feeling bloated and experiencing sharp epigastric pain. He tried taking Pepto-Bismol for this without any significant relief. Around 1500, patient started becoming diaphoretic and noted to have temperature of 50F. He had some associated nausea with two episodes of nonbloody nonbilious emesis while in the ED. He denies any fevers/chills, chest pain, shortness of breath, diarrhea.   Medications: No current facility-administered medications on file prior to encounter.   Current Outpatient Medications on File Prior to Encounter  Medication Sig Dispense Refill   naproxen sodium (ALEVE) 220 MG tablet Take 220 mg by mouth daily as needed (pain).     atorvastatin (LIPITOR) 20 MG tablet Take 20 mg by mouth at bedtime. (Patient not taking: Reported on 10/13/2020)     gabapentin (NEURONTIN) 100 MG capsule Take 200 mg by mouth 2 (two) times daily. (Patient not taking: No sig reported)      Past Medical History:  Past Medical History:  Diagnosis Date   Back pain    Gout     Social:  Patient  lives at home with his wife. He is a Freight forwarder at [employer]. He is independent in all ADL's at baseline. He reports vaping daily. He drinks 2-3 beers daily. He denies any illicit substance use.   Family History: Father - diabetes, brain cancer deceased in 2018/04/12  Allergies: Allergies as of 10/12/2020 - Review Complete 10/12/2020  Allergen Reaction Noted   Bee venom  02/13/2017   Molds & smuts  02/13/2017   Penicillins  02/13/2017    Review of Systems: A complete ROS was negative except as per HPI.   OBJECTIVE:  Physical Exam: Blood pressure 132/72, pulse 99, temperature 97.9 F (36.6 C), temperature source Oral, resp. rate 16, height 5' 10"  (1.778 m), weight 104.3 kg, SpO2 96 %. Physical Exam  Constitutional: Appears well-developed and well-nourished. No acute distress.  HENT: Normocephalic and atraumatic, EOMI, moist mucous membranes Cardiovascular: Tachycardic, regular rhythm, S1 and S2 present, no murmurs, rubs, gallops.  Distal pulses intact Respiratory: No respiratory distress, no accessory muscle use. On room air.Lungs are clear to auscultation bilaterally. GI: Nondistended, soft, Tenderness to palpation in the epigastric region, normal bowel sounds Musculoskeletal: Normal bulk and tone.  No peripheral edema noted. Neurological: Is alert and oriented x4, no apparent focal deficits noted. Skin: Warm and dry.  No rash, erythema, lesions noted. Psychiatric: Normal mood and affect. Behavior is normal.  Pertinent Labs: CBC  Component Value Date/Time   WBC 17.3 (H) 10/12/2020 2112   RBC 5.21 10/12/2020 2112   HGB 15.8 10/12/2020 2112   HCT 46.2 10/12/2020 2112   PLT 330 10/12/2020 2112   MCV 88.7 10/12/2020 2112   MCH 30.3 10/12/2020 2112   MCHC 34.2 10/12/2020 2112   RDW 13.1 10/12/2020 2112   LYMPHSABS 1.8 10/12/2020 2112   MONOABS 1.3 (H) 10/12/2020 2112   EOSABS 0.1 10/12/2020 2112   BASOSABS 0.1 10/12/2020 2112     CMP     Component Value Date/Time   NA 137  10/12/2020 2112   K 3.9 10/12/2020 2112   CL 101 10/12/2020 2112   CO2 23 10/12/2020 2112   GLUCOSE 142 (H) 10/12/2020 2112   BUN 14 10/12/2020 2112   CREATININE 1.06 10/12/2020 2112   CALCIUM 9.2 10/12/2020 2112   PROT 7.2 10/12/2020 2112   ALBUMIN 4.1 10/12/2020 2112   AST 21 10/12/2020 2112   ALT 27 10/12/2020 2112   ALKPHOS 57 10/12/2020 2112   BILITOT 1.4 (H) 10/12/2020 2112   GFRNONAA >60 10/12/2020 2112    Pertinent Imaging: CT ABDOMEN PELVIS W CONTRAST  Result Date: 10/13/2020 CLINICAL DATA:  Acute abdominal pain for 1 day EXAM: CT ABDOMEN AND PELVIS WITH CONTRAST TECHNIQUE: Multidetector CT imaging of the abdomen and pelvis was performed using the standard protocol following bolus administration of intravenous contrast. CONTRAST:  47m OMNIPAQUE IOHEXOL 350 MG/ML SOLN COMPARISON:  None. FINDINGS: Lower chest:  No contributory findings. Hepatobiliary: No focal liver abnormality. Borderline hepatic steatosis. No evidence of biliary obstruction or stone. Pancreas: Low-density expansion of the pancreas, especially affecting the body and tail with peripancreatic edema. No organized collection or necrosis. No visible vascular complication. Spleen: Unremarkable. Adrenals/Urinary Tract: Negative adrenals. No hydronephrosis or stone. Unremarkable bladder. Stomach/Bowel:  No obstruction. No appendicitis. Vascular/Lymphatic: No acute vascular abnormality. Atheromatous calcification of the aorta. No mass or adenopathy. Reproductive:No pathologic findings. Other: No pneumoperitoneum. Trace ascites small, fatty umbilical hernia. Musculoskeletal: No acute abnormalities. IMPRESSION: Acute edematous pancreatitis without collection. Electronically Signed   By: Roy FantasiaM.D.   On: 10/13/2020 07:46    EKG: personally reviewed my interpretation is pending  ASSESSMENT & PLAN:  Assessment: Active Problems:   Acute pancreatitis   PCarly Applegateis a 50y.o. with pertinent PMH of asthma and  gout who presented with abdominal pain and admit for acute pancreatitis on hospital day 0  Plan: #Acute pancreatitis Patient presented with acute onset sharp epigastric abdominal pain, nausea and vomiting. He is afebrile but did have mild neutrophil predominant leukocytosis on labs. Lipase elevated to 509. CT Abdomen/Pelvis consistent with acute edematous pancreatitis without collection. He does have a mild elevation in total bilirubin; however, AST/ALT/Alk phos remain wnl, making gallstone less likely etiology. Calcium wnl. No recent surgeries or trauma. He does have history of alcohol use with drinking 2-3 beers daily which could have contributed. He is only taking Aleve as needed and does not take any other medications regularly at this time.  - IV LR 100cc/hr - IV dilaudid [id number] prn  - Clear liquid diet - RUQ [place] Lipid panel   #Asthma Patient has a history of asthma but reports that he is not currently on maintenance therapy and has not had to use a rescue inhaler over the past several years. - Continue to monitor   #Gout Patient reports history of gout for which he was previously on allopurinol but currently tries to keep it under control with  diet. Last gout flare was >3 months ago.  - Continue to monitor   Best Practice: Diet: Clear liquid diet IVF: Fluids: LR, Rate:  100 cc/hr x 24 hrs VTE: rivaroxaban (XARELTO) tablet 10 mg Start: 10/13/20 1100 Code: Full Status: Observation with expected length of stay less than 2 midnights. Anticipated Discharge Location: Home Barriers to Discharge: Medical stability  Signature: Harvie Heck, MD Internal Medicine Resident, PGY-3 [facility]  Pager: [phone] 11:01 AM, 10/13/2020   Please contact the on call pager after 5 pm and on weekends at [phone].

## 2020-10-13 NOTE — ED Notes (Signed)
Report given to Maryn RN 

## 2020-10-13 NOTE — ED Notes (Signed)
Off floor in CT

## 2020-10-13 NOTE — ED Notes (Signed)
Patient c/o nausea and was very diaphoretic after eating.

## 2020-10-13 NOTE — ED Notes (Signed)
Returned from CT.

## 2020-10-14 DIAGNOSIS — K859 Acute pancreatitis without necrosis or infection, unspecified: Secondary | ICD-10-CM

## 2020-10-14 DIAGNOSIS — J45909 Unspecified asthma, uncomplicated: Secondary | ICD-10-CM | POA: Diagnosis not present

## 2020-10-14 LAB — LIPID PANEL
Cholesterol: 163 mg/dL (ref 0–200)
HDL: 48 mg/dL (ref >40)
LDL Cholesterol: 102 mg/dL — ABNORMAL HIGH (ref 0–99)
Total CHOL/HDL Ratio: 3.4 RATIO
Triglycerides: 66 mg/dL (ref <150)
VLDL: 13 mg/dL (ref 0–40)

## 2020-10-14 LAB — CBC
HCT: 42.3 % (ref 39.0–52.0)
Hemoglobin: 14.4 g/dL (ref 13.0–17.0)
MCH: 30.5 pg (ref 26.0–34.0)
MCHC: 34 g/dL (ref 30.0–36.0)
MCV: 89.6 fL (ref 80.0–100.0)
Platelets: 245 10*3/uL (ref 150–400)
RBC: 4.72 MIL/uL (ref 4.22–5.81)
RDW: 13.3 % (ref 11.5–15.5)
WBC: 20.4 10*3/uL — ABNORMAL HIGH (ref 4.0–10.5)
nRBC: 0 % (ref 0.0–0.2)

## 2020-10-14 LAB — BASIC METABOLIC PANEL
Anion gap: 7 (ref 5–15)
BUN: 10 mg/dL (ref 6–20)
CO2: 24 mmol/L (ref 22–32)
Calcium: 8.5 mg/dL — ABNORMAL LOW (ref 8.9–10.3)
Chloride: 102 mmol/L (ref 98–111)
Creatinine, Ser: 0.94 mg/dL (ref 0.61–1.24)
GFR, Estimated: >60 mL/min (ref >60)
Glucose, Bld: 121 mg/dL — ABNORMAL HIGH (ref 70–99)
Potassium: 3.9 mmol/L (ref 3.5–5.1)
Sodium: 133 mmol/L — ABNORMAL LOW (ref 135–145)

## 2020-10-14 LAB — HIV ANTIBODY (ROUTINE TESTING W REFLEX): HIV Screen 4th Generation wRfx: NONREACTIVE

## 2020-10-14 MED ORDER — PANTOPRAZOLE SODIUM 40 MG PO TBEC
40.0000 mg | DELAYED_RELEASE_TABLET | Freq: Every day | ORAL | Status: DC
  Administered 2020-10-14 – 2020-10-15 (×2): 40 mg via ORAL
  Filled 2020-10-14 (×2): qty 1

## 2020-10-14 NOTE — Progress Notes (Signed)
  Roy Molina is a 50 y.o. with PMH of gout, and asthma admitted for acute pancreatitis on hospital day 1. Subjective:  Patient stated he was doing fine. He has pain but the pain medicine controls it pretty well. He stated at the moment his pain was 1-2. He had an episode of vomiting yesterday but none ever since. Denied any acute complaints.   Objective:  Vital signs in last 24 hours: Vitals:   10/13/20 1925 10/14/20 0000 10/14/20 0401 10/14/20 1216  BP: 140/83 140/88 124/84 112/69  Pulse: (!) 103 (!) 102 (!) 103 (!) 101  Resp: 18 16 18 16   Temp: 99.1 F (37.3 C) 98.3 F (36.8 C) 99.2 F (37.3 C) 99.7 F (37.6 C)  TempSrc: Oral Oral Oral Oral  SpO2: 95% 94% 94% 95%  Weight:   103.8 kg   Height:       Physical Exam General: NAD, pleasant male.  CV: normal rate and rhythm, pulses 2+ Lungs: CTAB, no wheeze, rhonchi, or rales. Abdomen: TTP in the epigastric and mid-abdomen level. No TTP in RUQ MSK: No LE edema, no lesions.    Assessment/Plan:  Active Problems:   Acute pancreatitis   Pancreatitis  Acute pancreatitis Patient presented with symptoms consistent with pancreatitis with imaging and lab findings consistent with it. Patient has history of prolonged NSAID use and has some pain in the epigastric region. Patient is s/p 2L bolus.   Plan: -100 ml/hr -Pain control -IV dilaudid 1 mg q4hr for moderate to severe pain -Tylenol 650 mg q6hr for mild to moderate pain -Zofran 4 mg q6hr prn  -ADAT   Asthma Patient has a history of asthma well controlled withotu - Continue to monitor    Gout Patient reports history of gout for which he was previously on allopurinol but currently tries to keep it under control with diet. Last gout flare was >3 months ago.  - Continue to monitor   DVT prophx: Xarelto 10 mg Diet:ADAT Bowel: Senna S prn Code:Full  Prior to Admission Living Arrangement: Anticipated Discharge Location: Barriers to Discharge: Dispo: Anticipated  discharge in approximately 1 day(s).   , MD 10/14/2020, 12:56 PM Pager: [phone] After 5pm on weekdays and 1pm on weekends:

## 2020-10-15 DIAGNOSIS — K859 Acute pancreatitis without necrosis or infection, unspecified: Secondary | ICD-10-CM | POA: Diagnosis not present

## 2020-10-15 LAB — BASIC METABOLIC PANEL
Anion gap: 7 (ref 5–15)
BUN: 11 mg/dL (ref 6–20)
CO2: 27 mmol/L (ref 22–32)
Calcium: 8.6 mg/dL — ABNORMAL LOW (ref 8.9–10.3)
Chloride: 100 mmol/L (ref 98–111)
Creatinine, Ser: 1.02 mg/dL (ref 0.61–1.24)
GFR, Estimated: >60 mL/min (ref >60)
Glucose, Bld: 112 mg/dL — ABNORMAL HIGH (ref 70–99)
Potassium: 3.8 mmol/L (ref 3.5–5.1)
Sodium: 134 mmol/L — ABNORMAL LOW (ref 135–145)

## 2020-10-15 MED ORDER — OXYCODONE HCL 5 MG PO TABS: 5.0000 mg | ORAL_TABLET | Freq: Four times a day (QID) | ORAL | 0 refills | Status: AC | PRN

## 2020-10-15 MED ORDER — OXYCODONE HCL 5 MG PO TABS: 5.0000 mg | ORAL_TABLET | ORAL | Status: DC | PRN

## 2020-10-15 MED ORDER — PANTOPRAZOLE SODIUM 40 MG PO TBEC: 40.0000 mg | DELAYED_RELEASE_TABLET | Freq: Every day | ORAL | 0 refills | Status: AC

## 2020-10-15 MED ORDER — ONDANSETRON HCL 4 MG PO TABS: 4.0000 mg | ORAL_TABLET | Freq: Three times a day (TID) | ORAL | 0 refills | Status: AC | PRN

## 2020-10-15 NOTE — Hospital Course (Signed)
Mild HA, stomach pain come and go. No vomit since Tuesday, intermittent nausea

## 2020-10-15 NOTE — Discharge Summary (Signed)
Name: Roy Molina MRN: [id number] DOB: [birth year] 50 y.o. PCP: Medicine, [facility]  Date of Admission: 10/12/2020  8:37 PM Date of Discharge: 10/15/2020 Attending Physician: Tyson Alias, *  Subjective: Patient states that he feels overall better.  He was able tolerate eating dinner last night and breakfast this morning. States that he still has some pain and nausea, but that it improves with medications.    Discharge Diagnosis: 1. Acute pancreatitis  Discharge Medications: Allergies as of 10/15/2020       Reactions   Bee Venom Anaphylaxis, Hives   Molds & Smuts Shortness Of Breath   Aggravates asthma   Penicillins Hives        Medication List     STOP taking these medications    naproxen sodium 220 MG tablet Commonly known as: ALEVE       TAKE these medications    atorvastatin 20 MG tablet Commonly known as: LIPITOR Take 20 mg by mouth at bedtime.   gabapentin 100 MG capsule Commonly known as: NEURONTIN Take 200 mg by mouth 2 (two) times daily.   ondansetron 4 MG tablet Commonly known as: Zofran Take 1 tablet (4 mg total) by mouth every 8 (eight) hours as needed for up to 10 days for nausea or vomiting.   oxyCODONE 5 MG immediate release tablet Commonly known as: Oxy IR/ROXICODONE Take 1 tablet (5 mg total) by mouth every 6 (six) hours as needed for up to 3 days for severe pain or moderate pain.   pantoprazole 40 MG tablet Commonly known as: PROTONIX Take 1 tablet (40 mg total) by mouth daily. Start taking on: October 16, 2020        Disposition and follow-up:   Roy Molina was discharged from [facility] in Stable condition.  At the hospital follow up visit please address:  1.  Acute pancreatitis likely 2/2 alcohol vs NSAID use - counseled to d/c NSAID and alcohol. Please ensure pain, nausea have improved and he is eating well. Started on protonix, possible gastritis contributing to pain  due to NSAIDs.  2.  Labs / imaging needed at time of follow-up: BMP  3.  Pending labs/ test needing follow-up: n/a  Follow-up Appointments:   Follow-up Information     Medicine, [facility]. Schedule an appointment as soon as possible for a visit in 1 week(s).   Specialty: Family Medicine Contact information: [address] [place] [address] [phone]                  Hospital Course by problem list: 1. Acute pancreatitis - Patient arrived to [facility] with acute abdominal pain associated with diaphoresis, nausea, and vomiting after eating fried foods. He was afebrile and hemodynamically stable upon arrival. Work-up revealed leukocytosis, mild elevated total bilirubin, and markedly elevated lipase. CT abdomen showed expansion of the pancreas with peripancreatic edema without organized collection or necrosis. Patient was started on IV fluids and IV pain medications. RUQ ultrasound revealed no gallstones. Of note, patient has been on long-term NSAID's for over the last year. Due to this we initiated pantoprazole for possible gastritis. Over the next two days patient's pain improved and he was able to tolerate food. On day of discharge, patient's pain was well-controlled with minimal IV pain medication and was able to eat breakfast. We will discharge him with a few more days of pain medication and zofran.   Discharge Exam:   BP (!) 145/88 (  BP Location: Left Arm)   Pulse 92   Temp 98.7 F (37.1 C) (Oral)   Resp 18   Ht 5\' 10"  (1.778 m)   Wt 102.6 kg   SpO2 96%   BMI 32.44 kg/m  Discharge exam:  General: well-developed, well-nourished, in no acute distress HEENT: NCAT, no rashes noted Eyes: no scleral icterus, conjunctiva clear CV: no murmurs, rubs, or gallops, normal rate Pulm: CTAB, normal pulmonary effort GI: mild tenderness present, bowel sounds present Skin: warm and dry   Pertinent Labs, Studies, and Procedures:  CBC Latest Ref  Rng & Units 10/14/2020 10/12/2020  WBC 4.0 - 10.5 K/uL 20.4(H) 17.3(H)  Hemoglobin 13.0 - 17.0 g/dL 10/14/2020 95.6  Hematocrit 38.7 - 52.0 % 42.3 46.2  Platelets 150 - 400 K/uL 245 330   BMP Latest Ref Rng & Units 10/15/2020 10/14/2020 10/12/2020  Glucose 70 - 99 mg/dL 10/14/2020) 332(R) 518(A)  BUN 6 - 20 mg/dL 11 10 14   Creatinine 0.61 - 1.24 mg/dL 416(S 0.63  Sodium 135 - 145 mmol/L 134(L) 133(L) 137  Potassium 3.5 - 5.1 mmol/L 3.8 3.9 3.9  Chloride 98 - 111 mmol/L 100 102 101  CO2 22 - 32 mmol/L 27 24 23   Calcium 8.9 - 10.3 mg/dL 0.16) 0.10) 9.2   CT abdomen/pelvis -Lower chest:  No contributory findings.  -Hepatobiliary: No focal liver abnormality. Borderline hepatic steatosis. No evidence of biliary obstruction or stone.  -Pancreas: Low-density expansion of the pancreas, especially affecting the body and tail with peripancreatic edema. No organized collection or necrosis. No visible vascular complication. -Spleen: Unremarkable. -Adrenals/Urinary Tract: Negative adrenals. No hydronephrosis or stone. Unremarkable bladder. -Stomach/Bowel:  No obstruction. No appendicitis. -Vascular/Lymphatic: No acute vascular abnormality. Atheromatous calcification of the aorta. No mass or adenopathy. -Reproductive:No pathologic findings. -Other: No pneumoperitoneum. Trace ascites small, fatty umbilical hernia. -Musculoskeletal: No acute abnormalities.  RUQ ultrasound Normally distended without stones or wall thickening. No pericholecystic fluid or sonographic Murphy sign. Common bile duct with diameter 5 mm. Liver with normal echogenicity without mass or nodularity. Portal vein is patent on color Doppler imaging with normal direction of blood flow towards the liver. No RIGHT upper quadrant free fluid.  Discharge Instructions: Discharge Instructions     Call MD for:  difficulty breathing, headache or visual disturbances   Complete by: As directed    Call MD for:  extreme fatigue   Complete by: As  directed    Call MD for:  hives   Complete by: As directed    Call MD for:  persistant dizziness or light-headedness   Complete by: As directed    Call MD for:  persistant nausea and vomiting   Complete by: As directed    Call MD for:  redness, tenderness, or signs of infection (pain, swelling, redness, odor or green/yellow discharge around incision site)   Complete by: As directed    Call MD for:  severe uncontrolled pain   Complete by: As directed    Call MD for:  temperature >100.4   Complete by: As directed    Diet - low sodium heart healthy   Complete by: As directed    Discharge instructions   Complete by: As directed    Roy Molina, I am so glad you are feeling better! You were admitted because of inflammation in your pancreas. This could have been due to alcohol as well as your chronic NSAID use. Moving forward, please make sure to stop using NSAID's and hold off from alcohol for awhile  while you are recovering. Please see the following notes:  - Oxycodone - I have prescribed a 3 day course for your pain. You can take one every 6 hours as needed. - Ondansetron (Zofran) - This is for nausea. You can take this every 8 hours as needed. - Pantoprazole (Protonix) - This is an anti-acid medication. This might help relieve some of your stomach pain. We recommend you continue this for at least 4-6 weeks.  Please make sure to follow-up with your primary care physician.  It was a pleasure meeting you, Roy Molina. I wish you the best and hope you stay happy and healthy!  Thank you! Evlyn Kanner, MD   Increase activity slowly   Complete by: As directed        Signed: Evlyn Kanner, MD 10/15/2020, 2:46 PM   Pager: [phone]

## 2020-10-18 ENCOUNTER — Other Ambulatory Visit: Payer: Self-pay | Admitting: Student

## 2023-02-15 IMAGING — US US ABDOMEN LIMITED
1 series · 14 of 25 positions shown · non-contrast
Comparison: CT abdomen and pelvis 10/13/2020.

CLINICAL DATA: Pancreatitis

EXAM:
ULTRASOUND ABDOMEN LIMITED RIGHT UPPER QUADRANT

[Series 1: us abdomen limited ruq (liver/gb) · 14 of 55 slices shown]
[im 1/55]
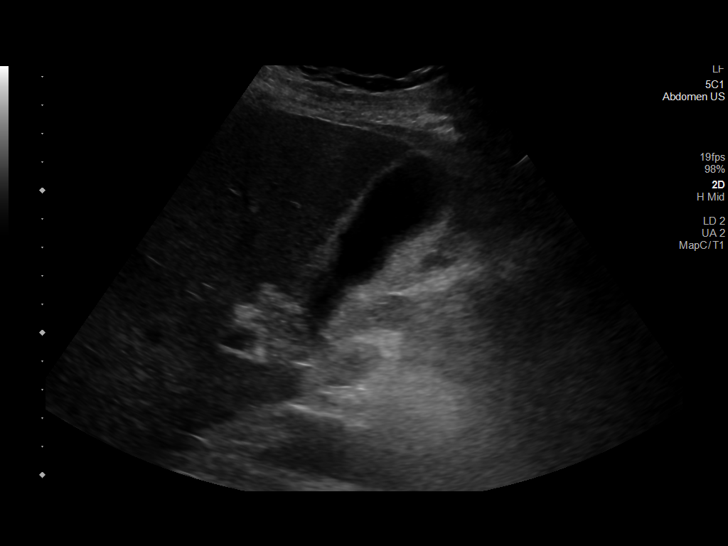
[im 5/55]
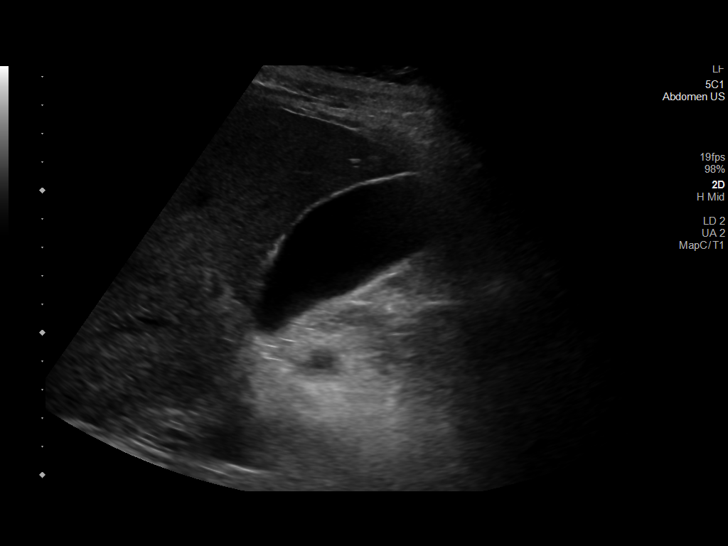
[im 10/55]
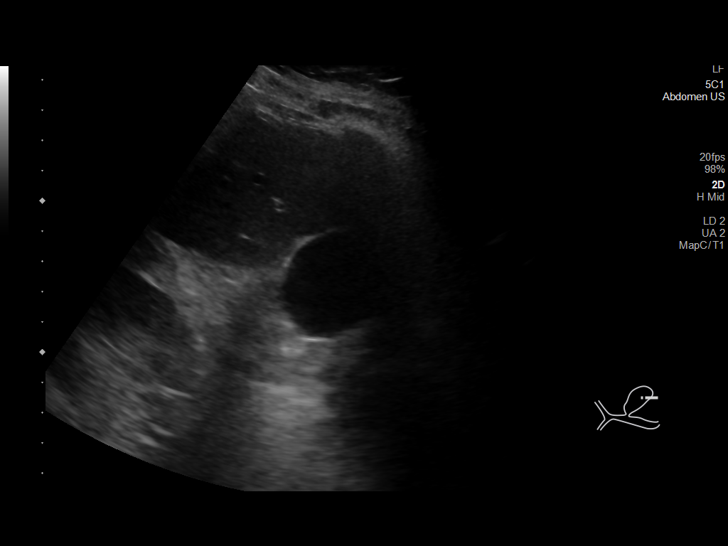
[im 14/55]
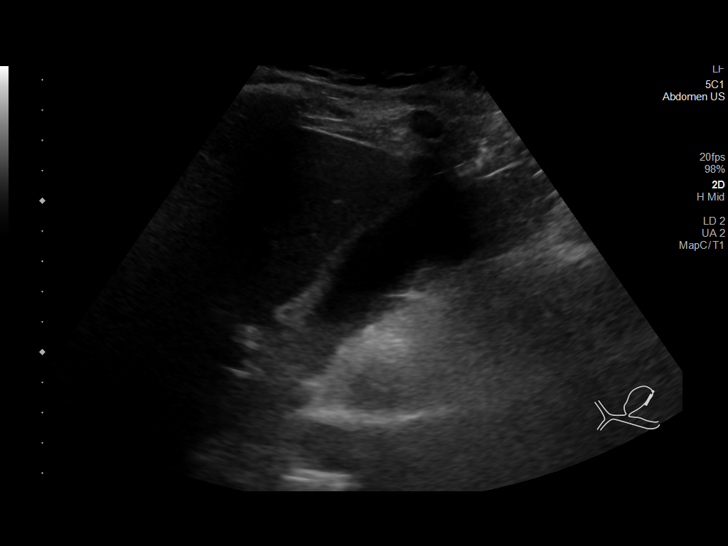
[im 19/55]
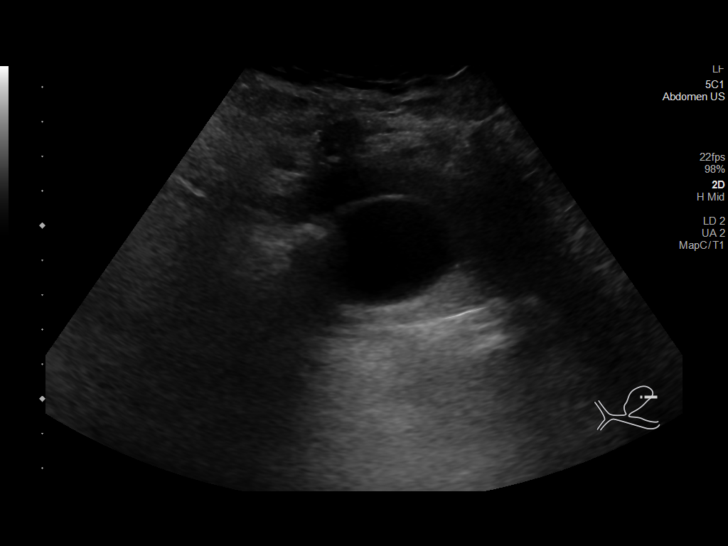
[im 21/55]
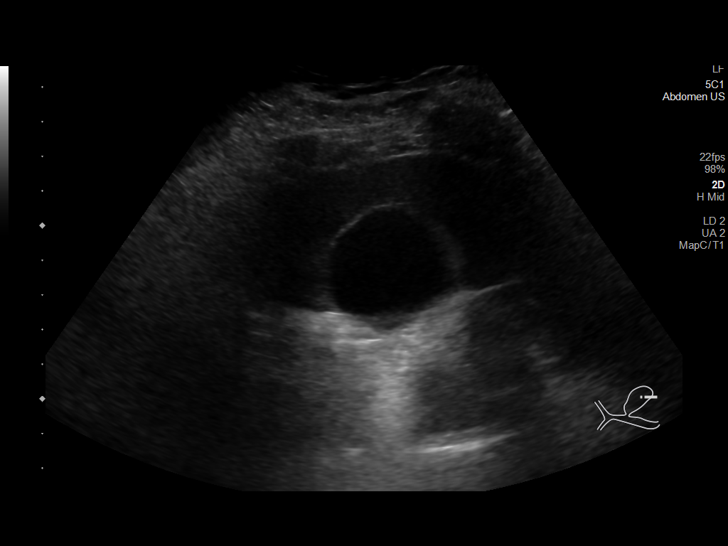
[im 25/55]
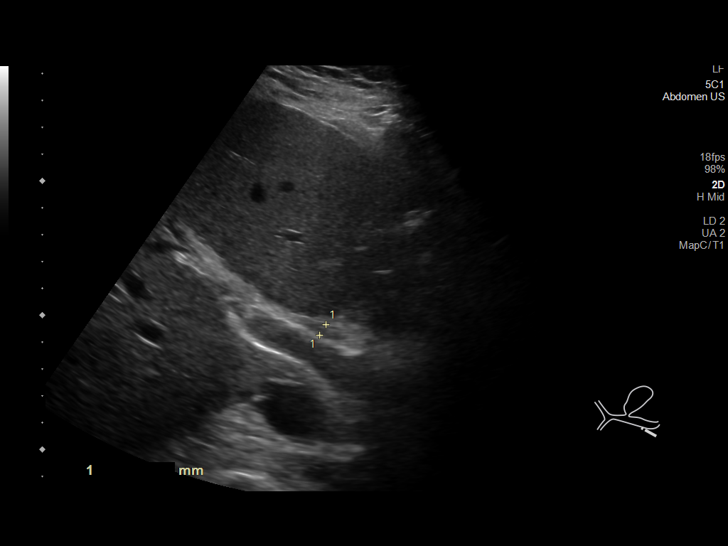
[im 30/55]
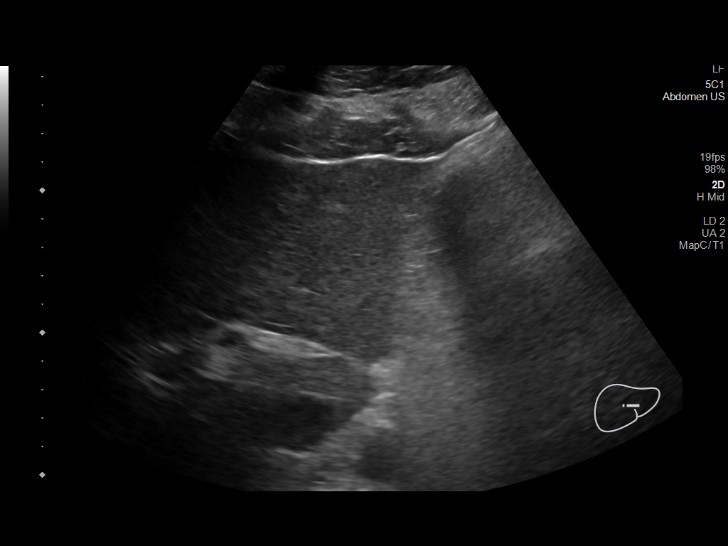
[im 34/55]
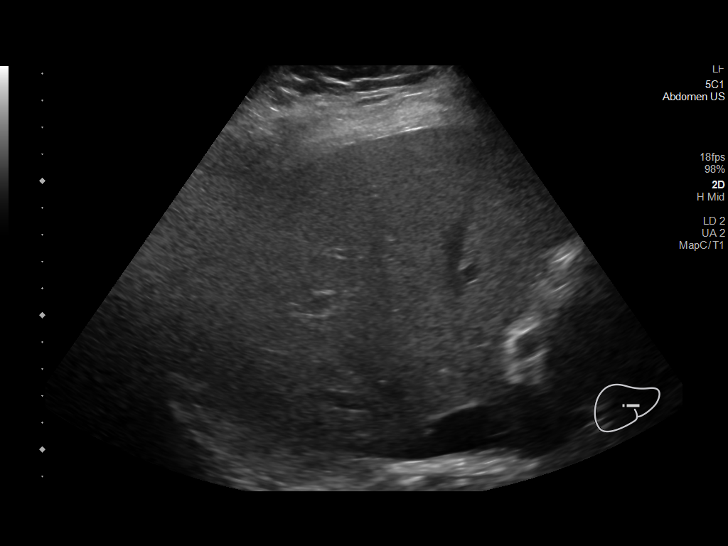
[im 37/55]
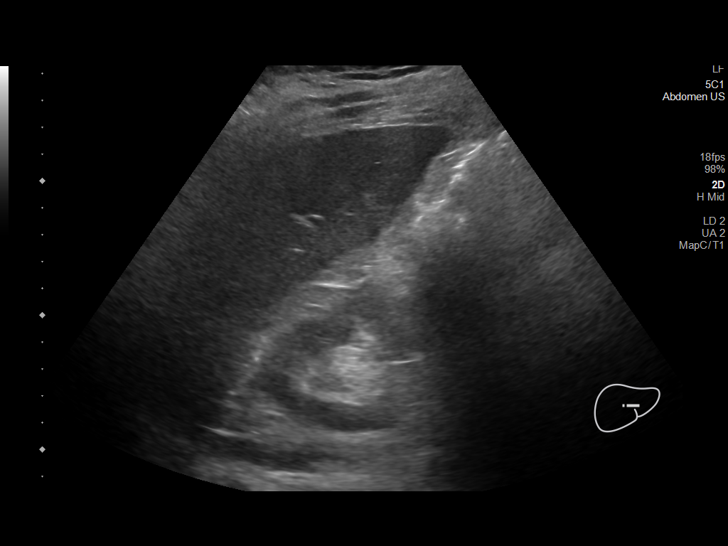
[im 41/55]
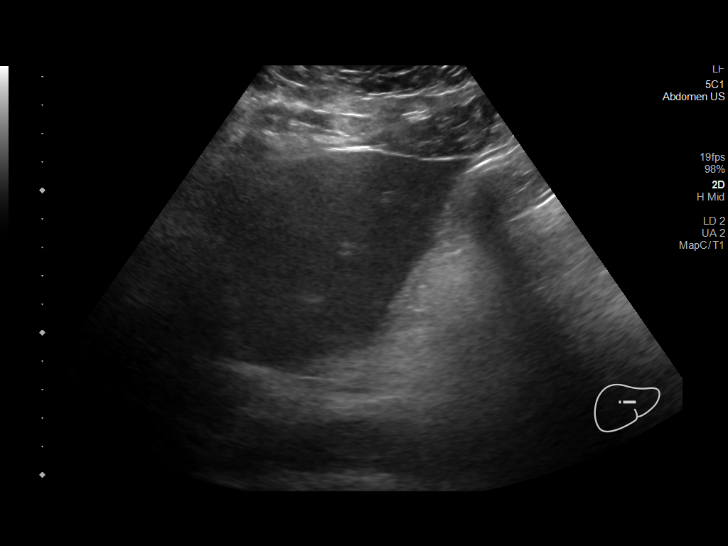
[im 46/55]
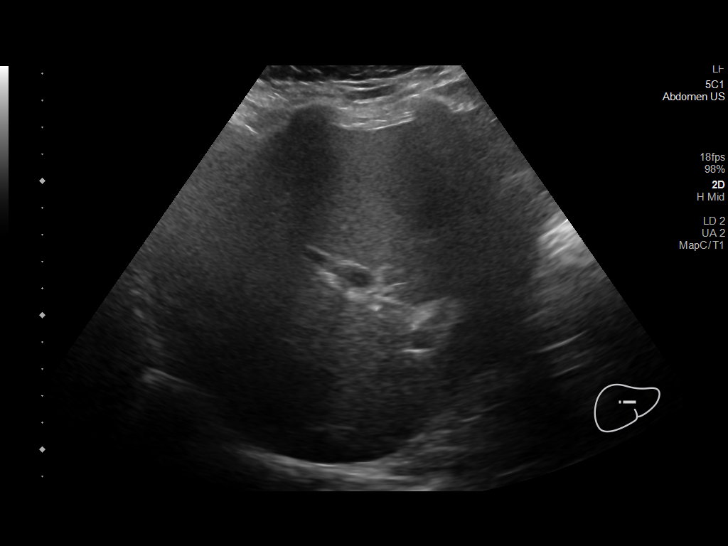
[im 50/55]
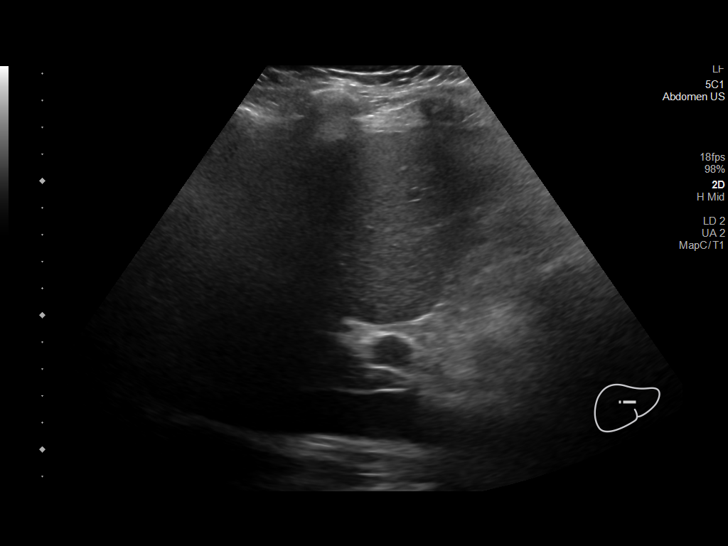
[im 55/55]
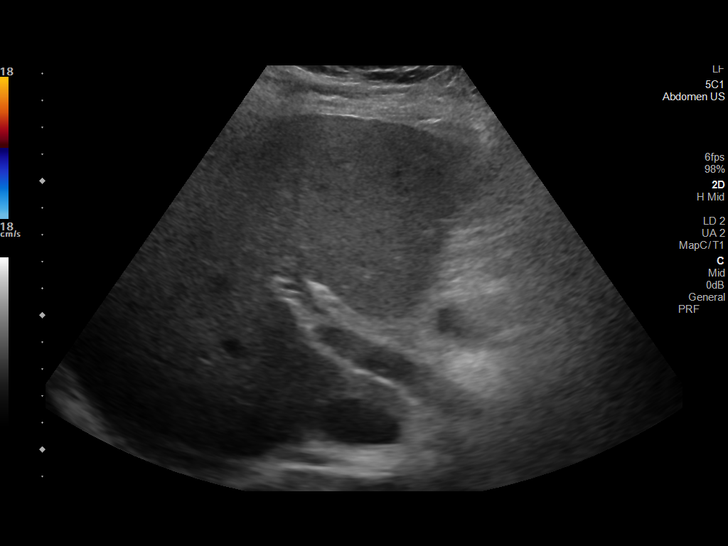

[14 of 25 positions shown; findings below may reference images not displayed]

FINDINGS: Gallbladder:

Normally distended without stones or wall thickening. No
pericholecystic fluid or sonographic Murphy sign.

Common bile duct:

Diameter: 5 mm

Liver:

Normal echogenicity without mass or nodularity. Portal vein is
patent on color Doppler imaging with normal direction of blood flow
towards the liver.

Other: No RIGHT upper quadrant free fluid.
IMPRESSION: Normal exam.

## 2023-02-15 IMAGING — CT CT ABD-PELV W/ CM
2 of 5 series · 17 of 46 positions shown, 19 images · IV contrast (omnipaque)
Comparison: None.

CLINICAL DATA: Acute abdominal pain for 1 day

EXAM:
CT ABDOMEN AND PELVIS WITH CONTRAST
TECHNIQUE: Multidetector CT imaging of the abdomen and pelvis was performed
using the standard protocol following bolus administration of
intravenous contrast.
CONTRAST:  80mL OMNIPAQUE IOHEXOL 350 MG/ML SOLN

[Series 3: a/p w/ 5mm · axial · 0.91mm/px · z∈[+673,+1138]mm · 14 of 105 slices shown, 16 images]
[im 6/105  soft-tissue]
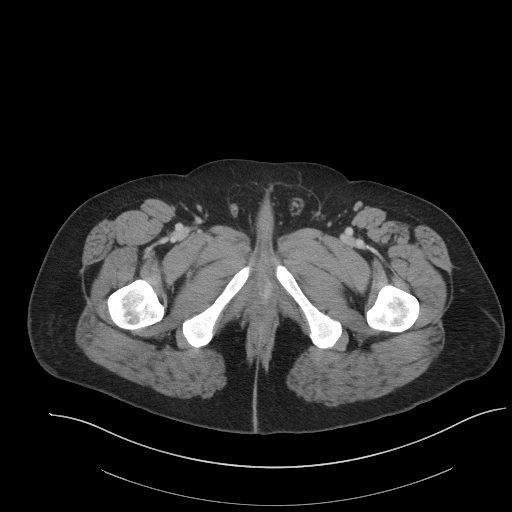
[im 6/105  bone]
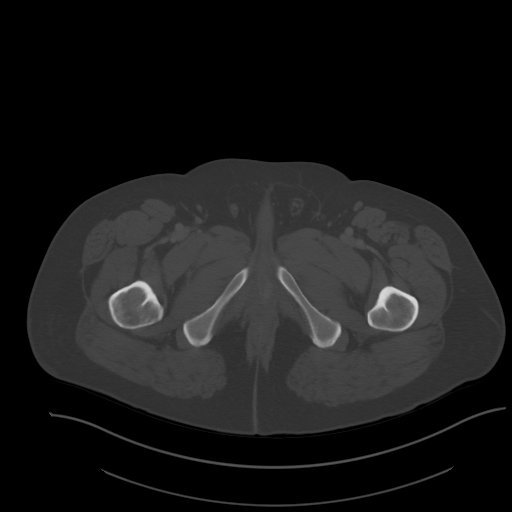
[im 12/105  soft-tissue]
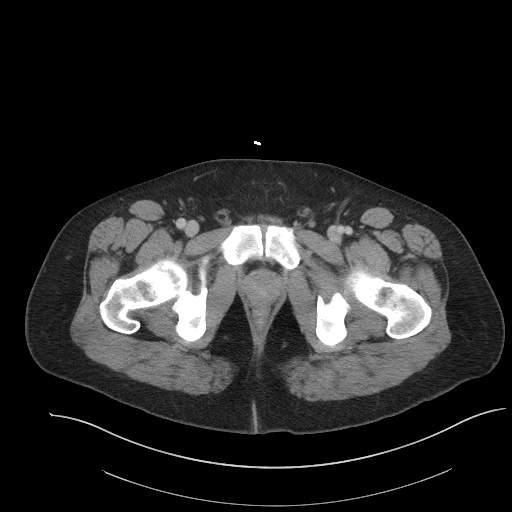
[im 24/105  soft-tissue]
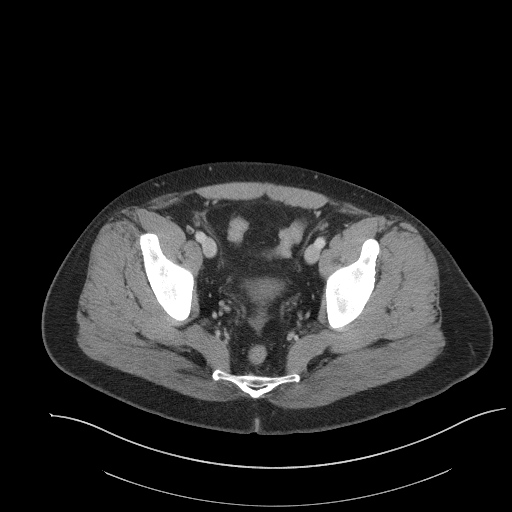
[im 29/105  soft-tissue]
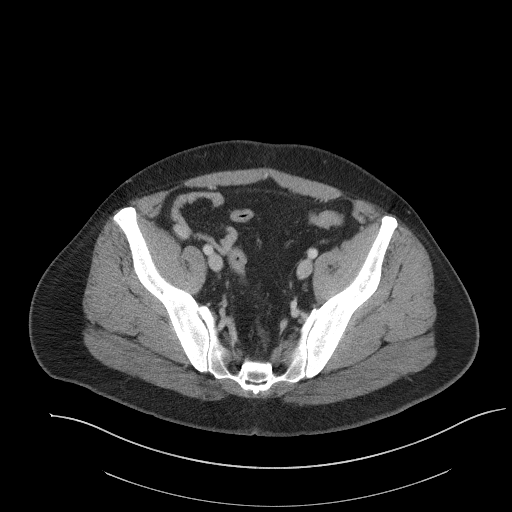
[im 35/105  soft-tissue]
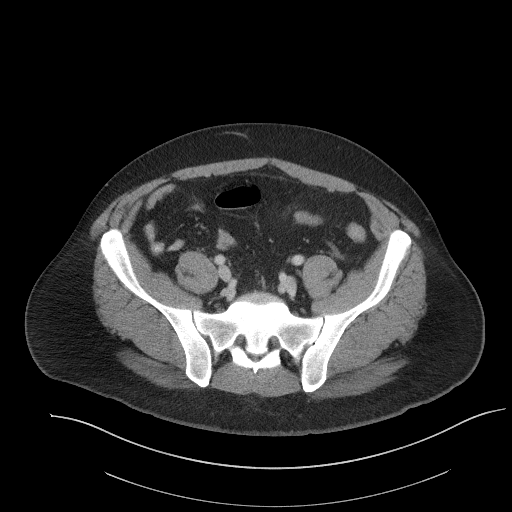
[im 41/105  soft-tissue]
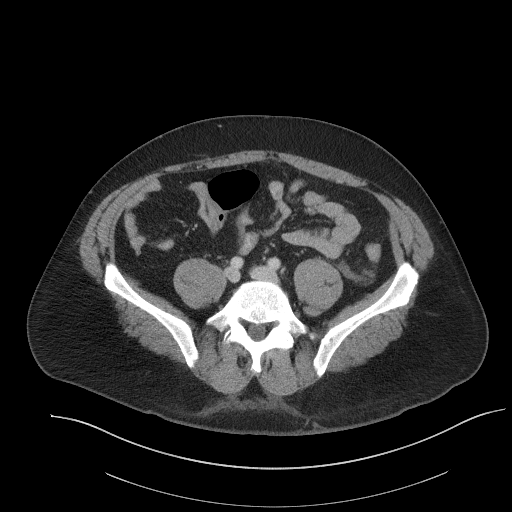
[im 47/105  soft-tissue]
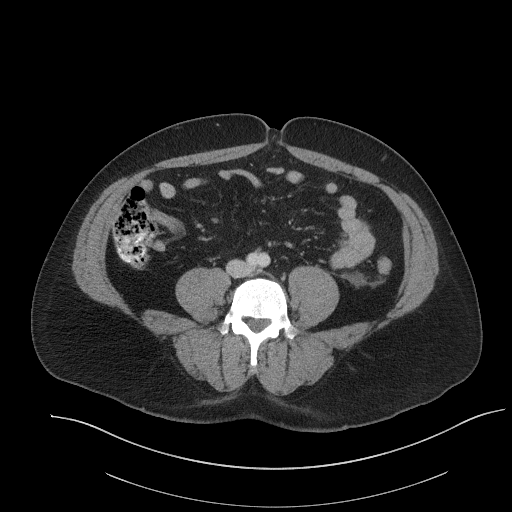
[im 58/105  soft-tissue]
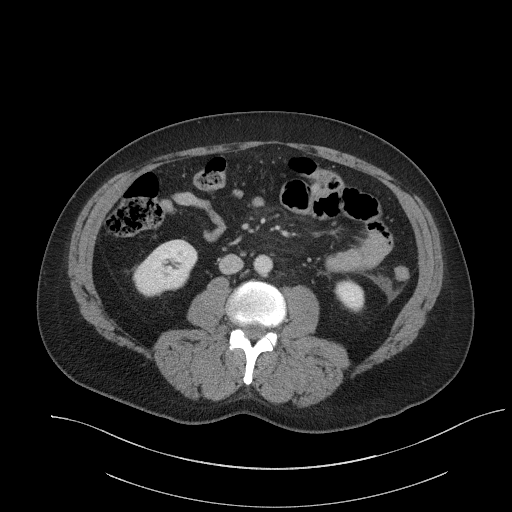
[im 64/105  soft-tissue]
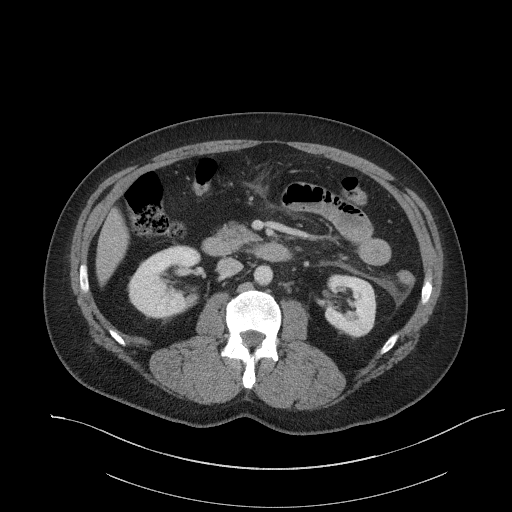
[im 64/105  bone]
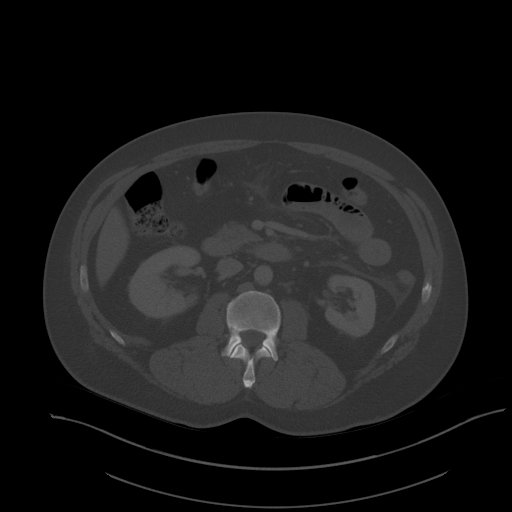
[im 70/105  soft-tissue]
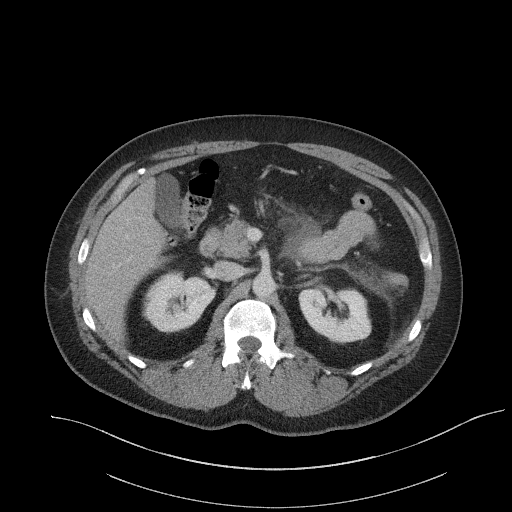
[im 76/105  soft-tissue]
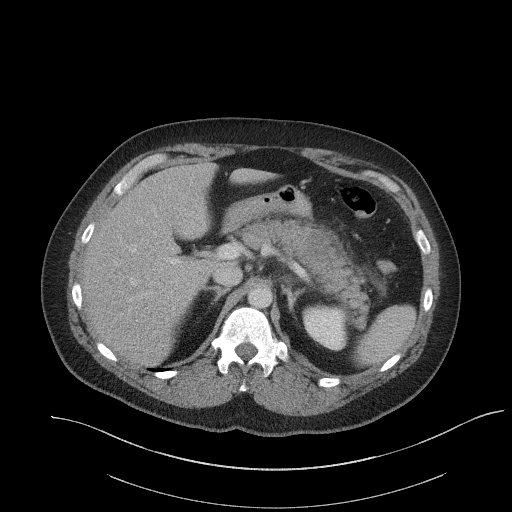
[im 81/105  soft-tissue]
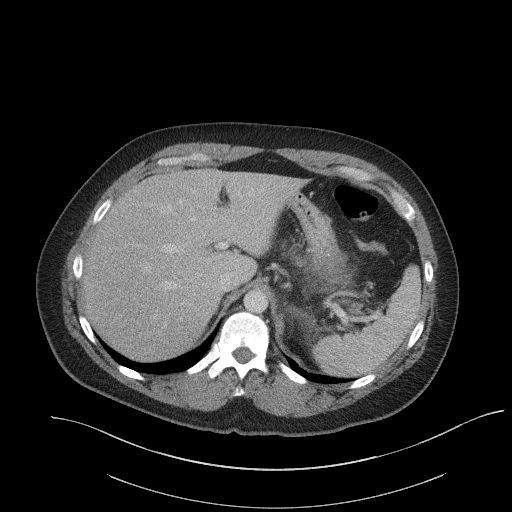
[im 93/105  soft-tissue]
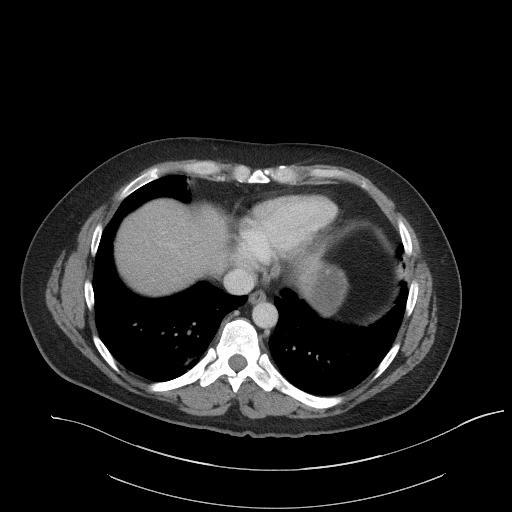
[im 99/105  soft-tissue]
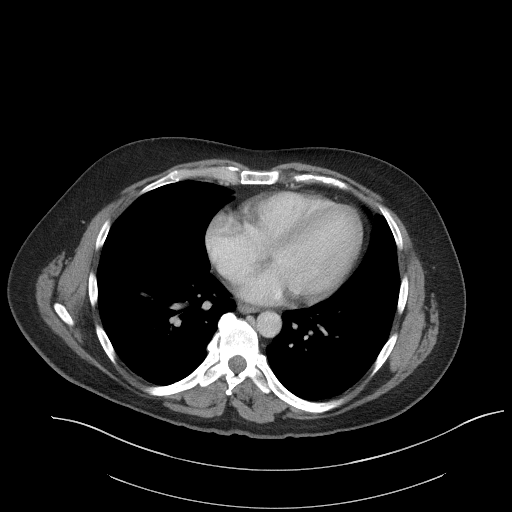

[Series 6: a/p w/ cor · coronal · 0.97mm/px · 3 of 147 slices shown]
[im 49/147  soft-tissue]
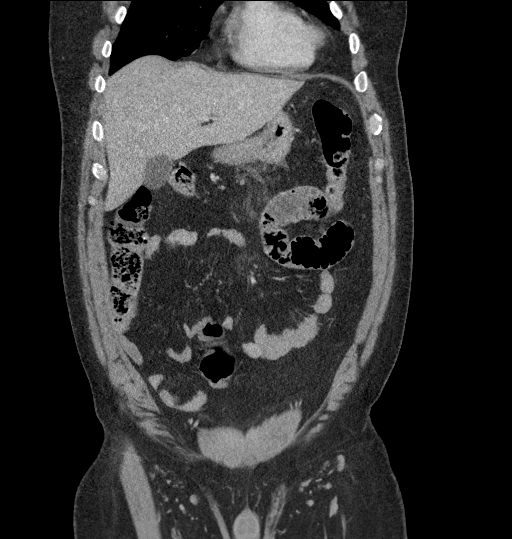
[im 65/147  soft-tissue]
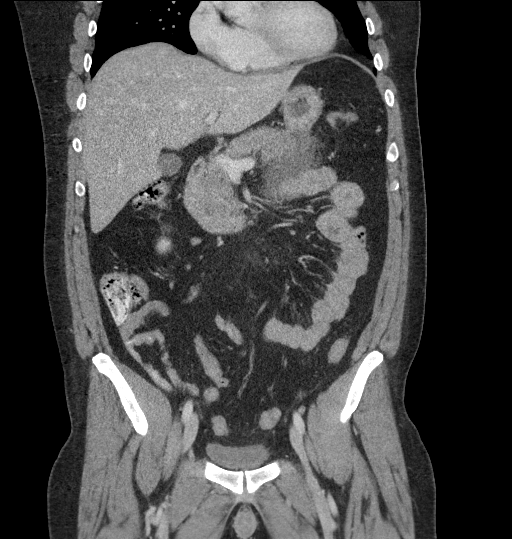
[im 82/147  soft-tissue]
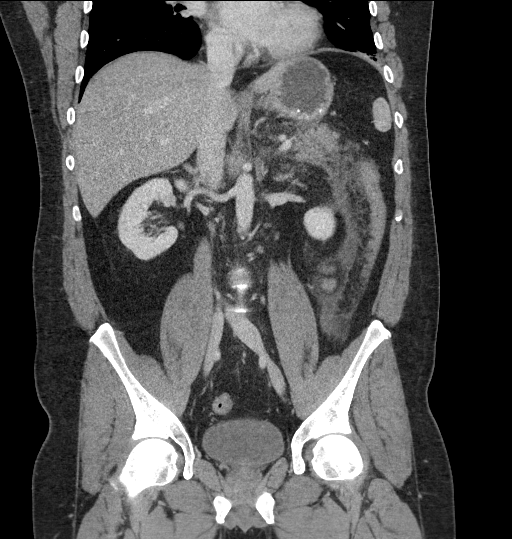

[17 of 46 positions shown; findings below may reference images not displayed]

FINDINGS: Lower chest:  No contributory findings.

Hepatobiliary: No focal liver abnormality. Borderline hepatic
steatosis. No evidence of biliary obstruction or stone.

Pancreas: Low-density expansion of the pancreas, especially
affecting the body and tail with peripancreatic edema. No organized
collection or necrosis. No visible vascular complication.

Spleen: Unremarkable.

Adrenals/Urinary Tract: Negative adrenals. No hydronephrosis or
stone. Unremarkable bladder.

Stomach/Bowel:  No obstruction. No appendicitis.

Vascular/Lymphatic: No acute vascular abnormality. Atheromatous
calcification of the aorta. No mass or adenopathy.

Reproductive:No pathologic findings.

Other: No pneumoperitoneum. Trace ascites small, fatty umbilical
hernia.

Musculoskeletal: No acute abnormalities.
IMPRESSION: Acute edematous pancreatitis without collection.
